# Patient Record
Sex: Female | Born: 1970 | Race: Black or African American | Hispanic: No | Marital: Single | Smoking: Never smoker
Health system: Southern US, Community
[De-identification: ages and names within clinical notes are randomized; demographics above are authoritative.]

## PROBLEM LIST (undated history)

## (undated) DIAGNOSIS — R51 Headache: Secondary | ICD-10-CM

## (undated) DIAGNOSIS — G4700 Insomnia, unspecified: Secondary | ICD-10-CM

## (undated) DIAGNOSIS — Z5189 Encounter for other specified aftercare: Secondary | ICD-10-CM

## (undated) DIAGNOSIS — IMO0001 Reserved for inherently not codable concepts without codable children: Secondary | ICD-10-CM

## (undated) DIAGNOSIS — K219 Gastro-esophageal reflux disease without esophagitis: Secondary | ICD-10-CM

## (undated) DIAGNOSIS — R32 Unspecified urinary incontinence: Secondary | ICD-10-CM

## (undated) DIAGNOSIS — N390 Urinary tract infection, site not specified: Secondary | ICD-10-CM

## (undated) DIAGNOSIS — R030 Elevated blood-pressure reading, without diagnosis of hypertension: Secondary | ICD-10-CM

## (undated) DIAGNOSIS — Z9289 Personal history of other medical treatment: Secondary | ICD-10-CM

## (undated) DIAGNOSIS — K8020 Calculus of gallbladder without cholecystitis without obstruction: Secondary | ICD-10-CM

## (undated) HISTORY — DX: Gastro-esophageal reflux disease without esophagitis: K21.9

## (undated) HISTORY — DX: Urinary tract infection, site not specified: N39.0

## (undated) HISTORY — DX: Headache: R51

## (undated) HISTORY — PX: TUBAL LIGATION: SHX77

## (undated) HISTORY — PX: COLONOSCOPY: SHX174

## (undated) HISTORY — PX: BREAST BIOPSY: SHX20

## (undated) HISTORY — PX: OTHER SURGICAL HISTORY: SHX169

## (undated) HISTORY — DX: Elevated blood-pressure reading, without diagnosis of hypertension: R03.0

## (undated) HISTORY — DX: Unspecified urinary incontinence: R32

---

## 2005-02-18 DIAGNOSIS — Z9289 Personal history of other medical treatment: Secondary | ICD-10-CM

## 2005-02-18 HISTORY — DX: Personal history of other medical treatment: Z92.89

## 2005-02-18 HISTORY — PX: OTHER SURGICAL HISTORY: SHX169

## 2010-09-12 ENCOUNTER — Encounter (INDEPENDENT_AMBULATORY_CARE_PROVIDER_SITE_OTHER): Payer: Self-pay | Admitting: *Deleted

## 2010-09-12 ENCOUNTER — Ambulatory Visit: Payer: Self-pay | Admitting: Family Medicine

## 2010-09-17 NOTE — Letter (Signed)
Summary: [facility] No Show Letter  [facility] [place]  [address] [place], [place] [address]   Phone: [phone]  Fax: [phone]    09/12/2010 MRN: [id number]  Olivia Simon [address] [place], [place]  [address]   Dear Ms. Toney,   Our records indicate that you missed your scheduled appointment with ____Dr.Tabori___________ on ____2/23/12______.  Please contact this office to reschedule your appointment as soon as possible.  It is important that you keep your scheduled appointments with your physician, so we can provide you the best care possible.  Please be advised that there may be a charge for "no show" appointments.    Sincerely,   [facility]

## 2010-10-02 ENCOUNTER — Ambulatory Visit: Payer: Self-pay | Admitting: Family Medicine

## 2010-10-02 DIAGNOSIS — Z0289 Encounter for other administrative examinations: Secondary | ICD-10-CM

## 2011-01-29 ENCOUNTER — Encounter: Payer: Self-pay | Admitting: Family Medicine

## 2011-01-29 ENCOUNTER — Ambulatory Visit (INDEPENDENT_AMBULATORY_CARE_PROVIDER_SITE_OTHER): Payer: 59 | Admitting: Family Medicine

## 2011-01-29 DIAGNOSIS — G4700 Insomnia, unspecified: Secondary | ICD-10-CM

## 2011-01-29 DIAGNOSIS — K649 Unspecified hemorrhoids: Secondary | ICD-10-CM

## 2011-01-29 DIAGNOSIS — K219 Gastro-esophageal reflux disease without esophagitis: Secondary | ICD-10-CM | POA: Insufficient documentation

## 2011-01-29 MED ORDER — ESOMEPRAZOLE MAGNESIUM 40 MG PO CPDR: 40.0000 mg | DELAYED_RELEASE_CAPSULE | Freq: Every day | ORAL | Status: DC

## 2011-01-29 MED ORDER — ZOLPIDEM TARTRATE 5 MG PO TABS: 5.0000 mg | ORAL_TABLET | Freq: Every evening | ORAL | Status: DC | PRN

## 2011-01-29 NOTE — Assessment & Plan Note (Signed)
Pt having difficulty adjusting to her alternating shifts.  Will start low dose Ambien for the first 2-3 days following each shift adjustment.  Pt expressed understanding and is in agreement w/ plan.

## 2011-01-29 NOTE — Patient Instructions (Signed)
Schedule your complete physical at your convenience- do not eat before this appt Start the Nexium daily for your reflux Someone will call you with your surgery appt Take the Ambien for the first 2-3 days after shift change to help w/ sleep Call with any questions or concerns Welcome!  We're glad to have you!

## 2011-01-29 NOTE — Assessment & Plan Note (Addendum)
Restart nexium as pt reports this has worked well in the past.  Reviewed lifestyle modifications.

## 2011-01-29 NOTE — Progress Notes (Signed)
  Subjective:    Patient ID: Olivia Simon, female    DOB: 1970-12-02, 40 y.o.   MRN: [id number]  HPI New to establish care.  [facility] maintenance- UTD on pap, mammo, colonoscopy.  GERD- ongoing problem for pt, worse w/ certain foods like spaghetti, orange juice, citrus fruits, anything acidic.  Was previously on Nexium w/ good results.  Not currently on meds.  Would like to restart.  Insomnia- works as a Biochemist, clinical, has variable work shifts.  Difficulty regulating sleep schedule.  Having difficulty falling asleep and staying asleep.  Particularly hard to fall asleep during the day when she works nights.  Shift will rotate every 2 weeks.  Has tried tylenol PM but has left over 'groggy' feeling in the AM.   Hemorrhoid- first developed during pregnancy 12 yrs ago, 'it will come and go'.  Not currently flared up.  Using Metamucil w/ each meal.  Would like referral to surgeon.   Review of Systems For ROS see HPI     Objective:   Physical Exam  Constitutional: She is oriented to person, place, and time. She appears well-developed and well-nourished. No distress.  HENT:  Head: Normocephalic and atraumatic.  Eyes: Conjunctivae and EOM are normal. Pupils are equal, round, and reactive to light.  Neck: Normal range of motion. Neck supple.  Cardiovascular: Normal rate, regular rhythm, normal heart sounds and intact distal pulses.   No murmur heard. Pulmonary/Chest: Effort normal and breath sounds normal. No respiratory distress. She has no wheezes. She has no rales.  Abdominal: Soft. Bowel sounds are normal. She exhibits no distension. There is no tenderness.  Lymphadenopathy:    She has no cervical adenopathy.  Neurological: She is alert and oriented to person, place, and time. No cranial nerve deficit. Coordination normal.  Skin: Skin is warm and dry.  Psychiatric: She has a normal mood and affect. Her behavior is normal.           Assessment & Plan:

## 2011-01-29 NOTE — Assessment & Plan Note (Signed)
Will refer to general surgery for evaluation and tx

## 2011-02-06 ENCOUNTER — Ambulatory Visit (INDEPENDENT_AMBULATORY_CARE_PROVIDER_SITE_OTHER): Payer: Self-pay | Admitting: General Surgery

## 2011-02-12 ENCOUNTER — Ambulatory Visit (INDEPENDENT_AMBULATORY_CARE_PROVIDER_SITE_OTHER): Payer: 59 | Admitting: Family Medicine

## 2011-02-12 DIAGNOSIS — Z20828 Contact with and (suspected) exposure to other viral communicable diseases: Secondary | ICD-10-CM

## 2011-02-12 DIAGNOSIS — K5900 Constipation, unspecified: Secondary | ICD-10-CM

## 2011-02-12 DIAGNOSIS — J309 Allergic rhinitis, unspecified: Secondary | ICD-10-CM

## 2011-02-12 DIAGNOSIS — G4700 Insomnia, unspecified: Secondary | ICD-10-CM

## 2011-02-12 MED ORDER — POLYETHYLENE GLYCOL 3350 17 GM/SCOOP PO POWD: 17.0000 g | Freq: Every day | ORAL | Status: AC

## 2011-02-12 NOTE — Patient Instructions (Signed)
We'll notify you of your test results Continue the excedrin PM as needed for sleep issues Start the Miralax daily as needed for constipation- goal is 1 bowel movement daily Call with any questions or concerns Hang in there!

## 2011-02-12 NOTE — Progress Notes (Signed)
  Subjective:    Patient ID: Olivia Simon, female    DOB: 21-Apr-1971, 40 y.o.   MRN: [id number]  HPI Insomnia- stopped the Ambien b/c it left her feeling groggy.  Now taking Excedrin PM as needed.  Constipation- reports she suffers from this, 'always have'.  Taking Metamucil TID w/out relief.  Tried colon cleanse w/out relief.  Will have BMs weekly.  STD testing- had oral sex and is now concerned.  Sore throat- having nasal congestion, cough, worse at night, no fevers.  + sick contacts.   Review of Systems For ROS see HPI     Objective:   Physical Exam  Constitutional: She appears well-developed and well-nourished. No distress.  HENT:  Head: Normocephalic and atraumatic.       TMs normal bilaterally Mild nasal congestion Throat w/out erythema, edema, or exudate.  + PND  Eyes: Conjunctivae and EOM are normal. Pupils are equal, round, and reactive to light.  Neck: Normal range of motion. Neck supple.  Cardiovascular: Normal rate, regular rhythm, normal heart sounds and intact distal pulses.   No murmur heard. Pulmonary/Chest: Effort normal and breath sounds normal. No respiratory distress. She has no wheezes.       No cough heard  Abdominal: Soft. Bowel sounds are normal. She exhibits no distension. There is no tenderness. There is no rebound and no guarding.  Lymphadenopathy:    She has no cervical adenopathy.          Assessment & Plan:

## 2011-02-13 LAB — GC/CHLAMYDIA PROBE AMP, URINE: GC Probe Amp, Urine: NEGATIVE

## 2011-02-18 ENCOUNTER — Telehealth: Payer: Self-pay | Admitting: Family Medicine

## 2011-02-18 NOTE — Telephone Encounter (Signed)
Discuss with patient  

## 2011-02-18 NOTE — Telephone Encounter (Signed)
Left message to call office

## 2011-02-18 NOTE — Telephone Encounter (Signed)
Can increase miralax to twice daily and add OTC stool softeners.

## 2011-02-25 DIAGNOSIS — J309 Allergic rhinitis, unspecified: Secondary | ICD-10-CM | POA: Insufficient documentation

## 2011-02-25 NOTE — Assessment & Plan Note (Signed)
No evidence of infxn on PE.  Encouraged OTC antihistamine to control sxs.  Reviewed supportive care and red flags that should prompt return.  Pt expressed understanding and is in agreement w/ plan.

## 2011-02-25 NOTE — Assessment & Plan Note (Signed)
Reviewed that it is possible to get STDs from oral sex.  Unable to openly discuss this as pt has her children w/ her.  Will test pt as requested.

## 2011-02-25 NOTE — Assessment & Plan Note (Signed)
Encouraged regular use of Miralax in addition to the metamucil to facilitate regular BMs. Pt expressed understanding and is in agreement w/ plan.

## 2011-02-25 NOTE — Assessment & Plan Note (Signed)
Pt unable to tolerate ambien due to AM grogginess.  Doing well on excedrin PM.  To continue this prn.

## 2011-03-21 ENCOUNTER — Encounter: Payer: Self-pay | Admitting: Family

## 2011-03-21 ENCOUNTER — Ambulatory Visit (INDEPENDENT_AMBULATORY_CARE_PROVIDER_SITE_OTHER): Payer: 59 | Admitting: Family

## 2011-03-21 VITALS — BP 94/70 | HR 72 | Temp 97.8°F | Resp 16 | Ht 65.0 in | Wt 190.1 lb

## 2011-03-21 DIAGNOSIS — N899 Noninflammatory disorder of vagina, unspecified: Secondary | ICD-10-CM

## 2011-03-21 DIAGNOSIS — N898 Other specified noninflammatory disorders of vagina: Secondary | ICD-10-CM | POA: Insufficient documentation

## 2011-03-21 NOTE — Patient Instructions (Signed)
We will contact you with the results of your testing.  

## 2011-03-21 NOTE — Assessment & Plan Note (Signed)
Wet prep and GC/Chlamydia DNA probe performed today.  Will treat based on test results.

## 2011-03-21 NOTE — Progress Notes (Signed)
  Subjective:    Patient ID: Olivia Simon, female    DOB: Jul 07, 1971, 40 y.o.   MRN: [id number]  HPI  Ms.  Simon is a 40 yr old female who presents today with chief complaint of vaginal irritation.  Denies vaginal pain or discharge. She notes that her symptoms started about 1 week ago.  Reports that she had sex 2 weeks ago for the first time in "years."  Used a condom.  Reports remote history of gonrrhea in high school.    Review of Systems See HPI  Past Medical History  Diagnosis Date  . Headache   . Urinary tract infection   . GERD (gastroesophageal reflux disease)     History   Social History  . Marital Status: Single    Spouse Name: N/A    Number of Children: N/A  . Years of Education: N/A   Occupational History  . Not on file.   Social History Main Topics  . Smoking status: Never Smoker   . Smokeless tobacco: Not on file  . Alcohol Use: Yes     occ.  . Drug Use: No  . Sexually Active:    Other Topics Concern  . Not on file   Social History Narrative  . No narrative on file    Past Surgical History  Procedure Date  . Breast biopsy   . Head surgery     left side  . Thigh surgery     Family History  Problem Relation Age of Onset  . Alcohol abuse Father   . Arthritis    . Breast cancer      grandmother  . Prostate cancer Father   . Stroke Sister   . Hypertension Father   . Diabetes Father     No Known Allergies  Current Outpatient Prescriptions on File Prior to Visit  Medication Sig Dispense Refill  . esomeprazole (NEXIUM) 40 MG capsule Take 1 capsule (40 mg total) by mouth daily.  30 capsule  3  . Multiple Vitamin (DAILY VITAMINS PO) Take by mouth daily.          BP 94/70  Pulse 72  Temp(Src) 97.8 F (36.6 C) (Oral)  Resp 16  Ht 5\' 5"  (1.651 m)  Wt 190 lb 1.3 oz (86.22 kg)  BMI 31.63 kg/m2  LMP 01/24/2011       Objective:   Physical Exam  Constitutional: She appears well-developed and well-nourished.  HENT:  Head:  Normocephalic and atraumatic.  Cardiovascular: Regular rhythm.   Genitourinary:       No vaginal lesions noted.  Creamy white vaginal discharge is noted. Cervix is noted to have nabothian cyst at 1 oclock.  Skin: Skin is warm.  Psychiatric: She has a normal mood and affect. Her behavior is normal. Judgment and thought content normal.          Assessment & Plan:

## 2011-03-21 NOTE — Progress Notes (Signed)
Addended by: Mervin Kung A on: 03/21/2011 04:11 PM   Modules accepted: Orders

## 2011-03-22 LAB — WET PREP BY MOLECULAR PROBE
Candida species: NEGATIVE
Trichomonas vaginosis: NEGATIVE

## 2011-03-24 ENCOUNTER — Telehealth: Payer: Self-pay | Admitting: Family

## 2011-03-24 MED ORDER — METRONIDAZOLE 0.75 % VA GEL: 1.0000 | Freq: Every day | VAGINAL | Status: AC

## 2011-03-24 NOTE — Telephone Encounter (Signed)
Pls call pt on her cell let pt know that her studies are neg for gonorrhea, chlamydia, yeast and trichomonas.  It does show bacterial vaginosis- an overgrowth of vaginal bacteria.  She should use metrogel once daily at bedtime for 5 days.

## 2011-03-25 NOTE — Telephone Encounter (Signed)
Left message on machine to return my call. 

## 2011-03-25 NOTE — Telephone Encounter (Signed)
Patient returned phone call and left voice message to contact her at [phone].   Call returned to patient at the requested phone number, no answer. A detailed voice message was left informing patient per Sandford Craze instructions.

## 2011-04-09 ENCOUNTER — Telehealth: Payer: Self-pay | Admitting: Family Medicine

## 2011-04-09 DIAGNOSIS — Z20828 Contact with and (suspected) exposure to other viral communicable diseases: Secondary | ICD-10-CM

## 2011-04-09 NOTE — Telephone Encounter (Signed)
Patient wants lab for std need order - patient established in July 2012 - are there any other labs she  Needs

## 2011-04-09 NOTE — Telephone Encounter (Signed)
Labs ordered.

## 2011-04-10 NOTE — Telephone Encounter (Signed)
Patient scheduled [id number] cpx - can only come in Friday - her day off

## 2011-04-18 ENCOUNTER — Encounter: Payer: Self-pay | Admitting: Family Medicine

## 2011-04-18 ENCOUNTER — Other Ambulatory Visit (HOSPITAL_COMMUNITY)
Admission: RE | Admit: 2011-04-18 | Discharge: 2011-04-18 | Disposition: A | Discharge status: Home / Self Care | Payer: 59 | Source: Ambulatory Visit | Attending: Family Medicine | Admitting: Family Medicine

## 2011-04-18 ENCOUNTER — Ambulatory Visit (INDEPENDENT_AMBULATORY_CARE_PROVIDER_SITE_OTHER): Payer: 59 | Admitting: Family Medicine

## 2011-04-18 DIAGNOSIS — N760 Acute vaginitis: Secondary | ICD-10-CM | POA: Insufficient documentation

## 2011-04-18 DIAGNOSIS — Z01818 Encounter for other preprocedural examination: Secondary | ICD-10-CM

## 2011-04-18 DIAGNOSIS — R87810 Cervical high risk human papillomavirus (HPV) DNA test positive: Secondary | ICD-10-CM | POA: Insufficient documentation

## 2011-04-18 DIAGNOSIS — Z124 Encounter for screening for malignant neoplasm of cervix: Secondary | ICD-10-CM

## 2011-04-18 DIAGNOSIS — Z01419 Encounter for gynecological examination (general) (routine) without abnormal findings: Secondary | ICD-10-CM | POA: Insufficient documentation

## 2011-04-18 DIAGNOSIS — Z0000 Encounter for general adult medical examination without abnormal findings: Secondary | ICD-10-CM | POA: Insufficient documentation

## 2011-04-18 DIAGNOSIS — Z20828 Contact with and (suspected) exposure to other viral communicable diseases: Secondary | ICD-10-CM

## 2011-04-18 NOTE — Assessment & Plan Note (Signed)
Pt's PE WNL w/ exception of vaginal d/c.  Will hold on tx until lab results available.  Check labs.  UTD on mammo.  Anticipatory guidance provided.

## 2011-04-18 NOTE — Assessment & Plan Note (Signed)
Pt wants to be screened for STDs

## 2011-04-18 NOTE — Assessment & Plan Note (Signed)
Pap collected. 

## 2011-04-18 NOTE — Patient Instructions (Addendum)
We'll notify you of your lab results Someone will call you with your GYN appt Call with any questions or concerns Have a great weekend!

## 2011-04-18 NOTE — Progress Notes (Signed)
  Subjective:    Patient ID: Olivia Simon, female    DOB: 26-Nov-1970, 40 y.o.   MRN: [id number]  HPI CPE- had mammo 6 months ago at [facility]  Due for pap.   Review of Systems Patient reports no vision/ hearing changes, adenopathy,fever, weight change,  persistant/recurrent hoarseness , swallowing issues, chest pain, palpitations, edema, persistant/recurrent cough, hemoptysis, dyspnea (rest/exertional/paroxysmal nocturnal), gastrointestinal bleeding (melena, rectal bleeding), abdominal pain, significant heartburn, bowel changes, GU symptoms (dysuria, hematuria, incontinence), Gyn symptoms (abnormal  bleeding, pain),  syncope, focal weakness, memory loss, numbness & tingling, skin/hair/nail changes, abnormal bruising or bleeding, anxiety, or depression.     Objective:   Physical Exam  General Appearance:    Alert, cooperative, no distress, appears stated age  Head:    Normocephalic, without obvious abnormality, atraumatic  Eyes:    PERRL, conjunctiva/corneas clear, EOM's intact, fundi    benign, both eyes  Ears:    Normal TM's and external ear canals, both ears  Nose:   Nares normal, septum midline, mucosa normal, no drainage    or sinus tenderness  Throat:   Lips, mucosa, and tongue normal; teeth and gums normal  Neck:   Supple, symmetrical, trachea midline, no adenopathy;    Thyroid: no enlargement/tenderness/nodules  Back:     Symmetric, no curvature, ROM normal, no CVA tenderness  Lungs:     Clear to auscultation bilaterally, respirations unlabored  Chest Wall:    No tenderness or deformity   Heart:    Regular rate and rhythm, S1 and S2 normal, no murmur, rub   or gallop  Breast Exam:    No tenderness, masses, or nipple abnormality  Abdomen:     Soft, non-tender, bowel sounds active all four quadrants,    no masses, no organomegaly  Genitalia:    External genitalia normal, cervix normal in appearance, no CMT, uterus in normal size and position, adnexa w/out mass or tenderness,  mucosa pink and moist, no lesions but copious white vaginal DC present  Rectal:    Normal external appearance  Extremities:   Extremities normal, atraumatic, no cyanosis or edema  Pulses:   2+ and symmetric all extremities  Skin:   Skin color, texture, turgor normal, no rashes or lesions  Lymph nodes:   Cervical, supraclavicular, and axillary nodes normal  Neurologic:   CNII-XII intact, normal strength, sensation and reflexes    throughout          Assessment & Plan:

## 2011-04-19 LAB — TSH: TSH: 0.568 u[IU]/mL (ref 0.350–4.500)

## 2011-04-19 LAB — BASIC METABOLIC PANEL
BUN: 12 mg/dL (ref 6–23)
CO2: 13 mEq/L — ABNORMAL LOW (ref 19–32)
Glucose, Bld: 58 mg/dL — ABNORMAL LOW (ref 70–99)
Potassium: 4.9 mEq/L (ref 3.5–5.3)
Sodium: 143 mEq/L (ref 135–145)

## 2011-04-19 LAB — CBC WITH DIFFERENTIAL/PLATELET
Basophils Relative: 0 % (ref 0–1)
HCT: 39.9 % (ref 36.0–46.0)
Hemoglobin: 13 g/dL (ref 12.0–15.0)
Lymphs Abs: 1.7 10*3/uL (ref 0.7–4.0)
MCH: 27.9 pg (ref 26.0–34.0)
MCHC: 32.6 g/dL (ref 30.0–36.0)
Monocytes Absolute: 0.5 10*3/uL (ref 0.1–1.0)
Monocytes Relative: 11 % (ref 3–12)
Neutro Abs: 2.5 10*3/uL (ref 1.7–7.7)
Neutrophils Relative %: 52 % (ref 43–77)
RBC: 4.66 MIL/uL (ref 3.87–5.11)

## 2011-04-19 LAB — HEPATIC FUNCTION PANEL
ALT: 9 U/L (ref 0–35)
AST: 16 U/L (ref 0–37)
Albumin: 3.8 g/dL (ref 3.5–5.2)
Alkaline Phosphatase: 97 U/L (ref 39–117)
Total Bilirubin: 0.4 mg/dL (ref 0.3–1.2)
Total Protein: 7.3 g/dL (ref 6.0–8.3)

## 2011-04-19 LAB — LIPID PANEL
HDL: 38 mg/dL — ABNORMAL LOW (ref >39)
LDL Cholesterol: 76 mg/dL (ref 0–99)

## 2011-04-19 LAB — RPR

## 2011-04-21 ENCOUNTER — Telehealth: Payer: Self-pay

## 2011-04-21 LAB — VITAMIN D 1,25 DIHYDROXY: Vitamin D 1, 25 (OH)2 Total: 62 pg/mL (ref 18–72)

## 2011-04-21 NOTE — Progress Notes (Signed)
Quick Note:  Left a message for pt to return call. ______ 

## 2011-04-21 NOTE — Progress Notes (Signed)
Quick Note:    Labs mailed.  ______

## 2011-04-21 NOTE — Telephone Encounter (Signed)
Pt aware of lab results 

## 2011-04-22 ENCOUNTER — Encounter: Payer: Self-pay | Admitting: Family Medicine

## 2011-04-30 ENCOUNTER — Telehealth: Payer: Self-pay

## 2011-04-30 MED ORDER — METRONIDAZOLE 500 MG PO TABS: 500.0000 mg | ORAL_TABLET | Freq: Two times a day (BID) | ORAL | Status: AC

## 2011-04-30 NOTE — Telephone Encounter (Signed)
Pt aware and results mailed.   Results also faxed to Dr. Lynwood Dawley office

## 2011-04-30 NOTE — Telephone Encounter (Signed)
Message copied by Beverely Low on Wed Apr 30, 2011  4:05 PM ------      Message from: Sheliah Hatch      Created: Wed Apr 30, 2011  3:35 PM       She has BV on her pap- should start Flagyl 500mg  bid x7 days.            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  Has appt upcoming to discuss tubal- this is more pressing

## 2011-05-01 NOTE — Telephone Encounter (Signed)
Called and spoke w/ pt regarding ASCUS pap and presence of high risk HPV.  Explained to her that this does not mean cervical cancer but it is important to f/u w/ GYN to avoid this progressing to eventual cancer.  Discussed that she will likely have a colposcopy and described what this is.  Answered pt's questions.  She feels more comfortable w/ dx and plan.

## 2011-05-16 ENCOUNTER — Other Ambulatory Visit: Payer: 59

## 2011-05-16 ENCOUNTER — Ambulatory Visit (INDEPENDENT_AMBULATORY_CARE_PROVIDER_SITE_OTHER): Payer: 59 | Admitting: Family Medicine

## 2011-05-16 VITALS — BP 100/72 | HR 72 | Temp 98.0°F | Ht 65.5 in | Wt 200.0 lb

## 2011-05-16 DIAGNOSIS — Z20828 Contact with and (suspected) exposure to other viral communicable diseases: Secondary | ICD-10-CM

## 2011-05-16 DIAGNOSIS — N6459 Other signs and symptoms in breast: Secondary | ICD-10-CM

## 2011-05-16 DIAGNOSIS — N644 Mastodynia: Secondary | ICD-10-CM | POA: Insufficient documentation

## 2011-05-16 LAB — HCG, QUANTITATIVE, PREGNANCY: hCG, Beta Chain, Quant, S: 0.7 m[IU]/mL

## 2011-05-16 NOTE — Progress Notes (Signed)
  Subjective:    Patient ID: Olivia Simon, female    DOB: 10-06-1970, 40 y.o.   MRN: [id number]  HPI ? Pregnancy- home test was negative last night.  Has not missed period- due on 11/5.  Pt reports sore nipples which has been a sign of pregnancy before.  Would like blood test done.  Last unprotected sex was 1 week ago.   Review of Systems For ROS see HPI     Objective:   Physical Exam  Vitals reviewed. Constitutional: She appears well-developed and well-nourished. No distress.  Pulmonary/Chest: Right breast exhibits tenderness. Right breast exhibits no mass, no nipple discharge and no skin change. Left breast exhibits tenderness. Left breast exhibits no mass, no nipple discharge and no skin change.  Psychiatric: She has a normal mood and affect. Her behavior is normal. Judgment and thought content normal.          Assessment & Plan:

## 2011-05-16 NOTE — Patient Instructions (Signed)
We'll call you as soon as we have results Call with any questions or concerns Happy Halloween!!!

## 2011-05-16 NOTE — Assessment & Plan Note (Signed)
Pt reports this is similar to how she felt when she was pregnant w/ her 2 sons.  Home upreg negative.  Check blood test.

## 2011-05-17 LAB — RPR

## 2011-05-19 ENCOUNTER — Telehealth: Payer: Self-pay | Admitting: *Deleted

## 2011-05-19 NOTE — Telephone Encounter (Signed)
Pt called this am to find results for tests advised all HIV-STD's advised that the results were negative however the Pregnancy test result was not listed, contacted LAB-call received back that the code is 0.70 noting the result of negative for pregnancy, tried to contact pt via number listed in demographic of [phone], both times calls went to GYN pack-if pt calls in give results and clarify correct number per only one in chart not correct

## 2011-05-22 ENCOUNTER — Encounter (HOSPITAL_COMMUNITY): Payer: Self-pay | Admitting: Pharmacy Technician

## 2011-05-27 NOTE — Patient Instructions (Addendum)
   Your procedure is scheduled on:  Friday, Nov 9th  Enter through the [employer] [facility] at: 10:00am Pick up the phone at the desk and dial [phone] and inform us of your arrival.  Please call this number if you have any problems the morning of surgery: [phone]  Remember: Do not eat food after midnight: Thursday   Do not drink clear liquids after: 7:30 am Friday Take these medicines the morning of surgery with a SIP OF WATER:  nexium  Do not wear jewelry, make-up, or FINGER nail polish Do not wear lotions, powders, or perfumes.  You may not  wear deodorant. Do not shave 48 hours prior to surgery. Do not bring valuables to the hospital.  Patients discharged on the day of surgery will not be allowed to drive home.    Home with Milana Na - friend    Remember to use your hibiclens as instructed.Please shower with 1/2 bottle the evening before your surgery and the other 1/2 bottle the morning of surgery.

## 2011-05-28 ENCOUNTER — Other Ambulatory Visit (HOSPITAL_COMMUNITY): Payer: 59

## 2011-05-28 ENCOUNTER — Encounter (HOSPITAL_COMMUNITY)
Admission: RE | Admit: 2011-05-28 | Discharge: 2011-05-28 | Disposition: A | Discharge status: Home / Self Care | Payer: 59 | Source: Ambulatory Visit | Attending: Obstetrics and Gynecology | Admitting: Obstetrics and Gynecology

## 2011-05-28 ENCOUNTER — Encounter (HOSPITAL_COMMUNITY): Payer: Self-pay

## 2011-05-28 HISTORY — DX: Encounter for other specified aftercare: Z51.89

## 2011-05-28 HISTORY — DX: Reserved for inherently not codable concepts without codable children: IMO0001

## 2011-05-28 LAB — CBC
HCT: 40.8 % (ref 36.0–46.0)
MCV: 86.4 fL (ref 78.0–100.0)
Platelets: 190 10*3/uL (ref 150–400)
RBC: 4.72 MIL/uL (ref 3.87–5.11)
WBC: 4.8 10*3/uL (ref 4.0–10.5)

## 2011-05-28 LAB — SURGICAL PCR SCREEN: Staphylococcus aureus: NEGATIVE

## 2011-05-28 NOTE — Pre-Procedure Instructions (Signed)
OK to see patient DOS 

## 2011-05-30 ENCOUNTER — Other Ambulatory Visit (HOSPITAL_COMMUNITY): Payer: Self-pay | Admitting: Obstetrics and Gynecology

## 2011-05-30 ENCOUNTER — Ambulatory Visit (HOSPITAL_COMMUNITY)
Admission: RE | Admit: 2011-05-30 | Discharge: 2011-05-30 | Disposition: A | Discharge status: Home / Self Care | Payer: 59 | Source: Ambulatory Visit | Attending: Obstetrics and Gynecology | Admitting: Obstetrics and Gynecology

## 2011-05-30 ENCOUNTER — Other Ambulatory Visit: Payer: Self-pay | Admitting: Obstetrics and Gynecology

## 2011-05-30 ENCOUNTER — Encounter (HOSPITAL_COMMUNITY): Payer: Self-pay

## 2011-05-30 ENCOUNTER — Encounter (HOSPITAL_COMMUNITY): Payer: Self-pay | Admitting: Anesthesiology

## 2011-05-30 ENCOUNTER — Encounter (HOSPITAL_COMMUNITY)
Admission: RE | Disposition: A | Discharge status: Home / Self Care | Payer: Self-pay | Source: Ambulatory Visit | Attending: Obstetrics and Gynecology

## 2011-05-30 ENCOUNTER — Ambulatory Visit (HOSPITAL_COMMUNITY): Payer: 59 | Admitting: Anesthesiology

## 2011-05-30 DIAGNOSIS — Z01812 Encounter for preprocedural laboratory examination: Secondary | ICD-10-CM | POA: Insufficient documentation

## 2011-05-30 DIAGNOSIS — Z01818 Encounter for other preprocedural examination: Secondary | ICD-10-CM | POA: Insufficient documentation

## 2011-05-30 DIAGNOSIS — Z302 Encounter for sterilization: Secondary | ICD-10-CM | POA: Insufficient documentation

## 2011-05-30 HISTORY — PX: LAPAROSCOPIC TUBAL LIGATION: SHX1937

## 2011-05-30 LAB — PREGNANCY, URINE: Preg Test, Ur: NEGATIVE

## 2011-05-30 SURGERY — LIGATION, FALLOPIAN TUBE, LAPAROSCOPIC
Anesthesia: General | Site: Abdomen | Laterality: Bilateral | Wound class: Clean Contaminated

## 2011-05-30 MED ORDER — GLYCOPYRROLATE 0.2 MG/ML IJ SOLN
INTRAMUSCULAR | Status: AC
  Filled 2011-05-30: qty 1

## 2011-05-30 MED ORDER — PROPOFOL 10 MG/ML IV EMUL
INTRAVENOUS | Status: AC
  Filled 2011-05-30: qty 20

## 2011-05-30 MED ORDER — KETOROLAC TROMETHAMINE 30 MG/ML IJ SOLN
INTRAMUSCULAR | Status: DC | PRN
  Administered 2011-05-30: 30 mg via INTRAVENOUS

## 2011-05-30 MED ORDER — ROCURONIUM BROMIDE 100 MG/10ML IV SOLN
INTRAVENOUS | Status: DC | PRN
  Administered 2011-05-30: 35 mg via INTRAVENOUS

## 2011-05-30 MED ORDER — FENTANYL CITRATE 0.05 MG/ML IJ SOLN
INTRAMUSCULAR | Status: DC | PRN
  Administered 2011-05-30 (×2): 100 ug via INTRAVENOUS

## 2011-05-30 MED ORDER — FENTANYL CITRATE 0.05 MG/ML IJ SOLN
INTRAMUSCULAR | Status: AC
  Administered 2011-05-30: 50 ug via INTRAVENOUS
  Filled 2011-05-30: qty 2

## 2011-05-30 MED ORDER — FENTANYL CITRATE 0.05 MG/ML IJ SOLN
INTRAMUSCULAR | Status: AC
  Filled 2011-05-30: qty 5

## 2011-05-30 MED ORDER — ONDANSETRON HCL 4 MG/2ML IJ SOLN
INTRAMUSCULAR | Status: DC | PRN
  Administered 2011-05-30: 4 mg via INTRAVENOUS

## 2011-05-30 MED ORDER — OXYCODONE-ACETAMINOPHEN 5-325 MG PO TABS
1.0000 | ORAL_TABLET | ORAL | Status: DC | PRN
  Administered 2011-05-30: 1 via ORAL

## 2011-05-30 MED ORDER — LIDOCAINE HCL (CARDIAC) 20 MG/ML IV SOLN
INTRAVENOUS | Status: AC
  Filled 2011-05-30: qty 5

## 2011-05-30 MED ORDER — DEXAMETHASONE SODIUM PHOSPHATE 10 MG/ML IJ SOLN
INTRAMUSCULAR | Status: AC
  Filled 2011-05-30: qty 1

## 2011-05-30 MED ORDER — LACTATED RINGERS IV SOLN
INTRAVENOUS | Status: DC
  Administered 2011-05-30 (×2): via INTRAVENOUS

## 2011-05-30 MED ORDER — MIDAZOLAM HCL 5 MG/5ML IJ SOLN
INTRAMUSCULAR | Status: DC | PRN
  Administered 2011-05-30: 2 mg via INTRAVENOUS

## 2011-05-30 MED ORDER — NEOSTIGMINE METHYLSULFATE 1 MG/ML IJ SOLN
INTRAMUSCULAR | Status: DC | PRN
  Administered 2011-05-30: 5 mg via INTRAVENOUS

## 2011-05-30 MED ORDER — DEXAMETHASONE SODIUM PHOSPHATE 10 MG/ML IJ SOLN
INTRAMUSCULAR | Status: DC | PRN
  Administered 2011-05-30: 10 mg via INTRAVENOUS

## 2011-05-30 MED ORDER — OXYCODONE-ACETAMINOPHEN 5-325 MG PO TABS
ORAL_TABLET | ORAL | Status: AC
  Administered 2011-05-30: 1 via ORAL
  Filled 2011-05-30: qty 1

## 2011-05-30 MED ORDER — PROPOFOL 10 MG/ML IV EMUL
INTRAVENOUS | Status: DC | PRN
  Administered 2011-05-30: 200 mg via INTRAVENOUS

## 2011-05-30 MED ORDER — KETOROLAC TROMETHAMINE 30 MG/ML IJ SOLN
INTRAMUSCULAR | Status: AC
  Filled 2011-05-30: qty 1

## 2011-05-30 MED ORDER — FENTANYL CITRATE 0.05 MG/ML IJ SOLN
25.0000 ug | INTRAMUSCULAR | Status: DC | PRN
  Administered 2011-05-30 (×2): 50 ug via INTRAVENOUS

## 2011-05-30 MED ORDER — NEOSTIGMINE METHYLSULFATE 1 MG/ML IJ SOLN
INTRAMUSCULAR | Status: AC
  Filled 2011-05-30: qty 10

## 2011-05-30 MED ORDER — ONDANSETRON HCL 4 MG/2ML IJ SOLN
INTRAMUSCULAR | Status: AC
  Filled 2011-05-30: qty 2

## 2011-05-30 MED ORDER — DEXTROSE IN LACTATED RINGERS 5 % IV SOLN: INTRAVENOUS | Status: DC

## 2011-05-30 MED ORDER — DIPHENHYDRAMINE HCL 25 MG PO CAPS
25.0000 mg | ORAL_CAPSULE | Freq: Once | ORAL | Status: DC
  Filled 2011-05-30: qty 1

## 2011-05-30 MED ORDER — MIDAZOLAM HCL 2 MG/2ML IJ SOLN
INTRAMUSCULAR | Status: AC
  Filled 2011-05-30: qty 2

## 2011-05-30 MED ORDER — GLYCOPYRROLATE 0.2 MG/ML IJ SOLN
INTRAMUSCULAR | Status: DC | PRN
  Administered 2011-05-30: .8 mg via INTRAVENOUS

## 2011-05-30 MED ORDER — ROCURONIUM BROMIDE 50 MG/5ML IV SOLN
INTRAVENOUS | Status: AC
  Filled 2011-05-30: qty 1

## 2011-05-30 MED ORDER — LIDOCAINE HCL (CARDIAC) 20 MG/ML IV SOLN
INTRAVENOUS | Status: DC | PRN
  Administered 2011-05-30: 80 mg via INTRAVENOUS

## 2011-05-30 SURGICAL SUPPLY — 19 items
BENZOIN TINCTURE PRP APPL 2/3 (GAUZE/BANDAGES/DRESSINGS) IMPLANT
CATH ROBINSON RED A/P 16FR (CATHETERS) IMPLANT
CATH SILICONE 16FRX5CC (CATHETERS) ×2 IMPLANT
CHLORAPREP W/TINT 26ML (MISCELLANEOUS) ×2 IMPLANT
CLOTH BEACON ORANGE TIMEOUT ST (SAFETY) ×2 IMPLANT
DERMABOND ADVANCED (GAUZE/BANDAGES/DRESSINGS) ×1
DERMABOND ADVANCED .7 DNX12 (GAUZE/BANDAGES/DRESSINGS) ×1 IMPLANT
GLOVE BIO SURGEON STRL SZ 6.5 (GLOVE) ×2 IMPLANT
GLOVE BIOGEL PI IND STRL 7.0 (GLOVE) ×1 IMPLANT
GLOVE BIOGEL PI INDICATOR 7.0 (GLOVE) ×1
GOWN PREVENTION PLUS LG XLONG (DISPOSABLE) ×4 IMPLANT
PACK LAPAROSCOPY BASIN (CUSTOM PROCEDURE TRAY) ×2 IMPLANT
STRIP CLOSURE SKIN 1/4X4 (GAUZE/BANDAGES/DRESSINGS) IMPLANT
SUT VICRYL 0 UR6 27IN ABS (SUTURE) ×2 IMPLANT
SUT VICRYL 4-0 PS2 18IN ABS (SUTURE) ×2 IMPLANT
TOWEL OR 17X24 6PK STRL BLUE (TOWEL DISPOSABLE) ×4 IMPLANT
TROCAR Z-THREAD FIOS 11X100 BL (TROCAR) ×2 IMPLANT
TROCAR Z-THREAD FIOS 5X100MM (TROCAR) ×2 IMPLANT
WATER STERILE IRR 1000ML POUR (IV SOLUTION) ×2 IMPLANT

## 2011-05-30 NOTE — Op Note (Signed)
05/30/2011  10:55 AM  PATIENT:  Olivia Simon  40 y.o. female  PRE-OPERATIVE DIAGNOSIS:  desires sterilization  POST-OPERATIVE DIAGNOSIS:  desires sterilization  PROCEDURE:  Procedure(s): LAPAROSCOPIC TUBAL LIGATION, ASPIRATION OF CYST FLUID  The patient was placed on the table in a supine position. General anesthesia was induced. She was then placed in a dorsal lithotomy position. The abdomen and vagina were sterilely prepped in the usual fashion. The bladder was drained with a Foley catheter.  A single-tooth tenaculum was placed on the cervix.  Then a Hulka tenaculum was placed on the cervix and we went above. An infraumbilical incision was made without difficulty. The trocar was placed without difficulty and the laparoscope was inserted. We confirmed we were in the peritoneal cavity. CO2 was then insufflated into the abdomen. A suprapubic incision was made in the midline and a 5 mm trocar was inserted without difficulty. Upon visualization of the pelvic cavity the uterus and tubes did appear normal.  The right tube was grasped and traced to its fimbriated end. The midportion was cauterized in 3 successive areas and released. The left tube was then identified and traced to its fimbriated end. It was then cauterized in 3 successive areas and released. On the right ovary a cyst was noted.  The ovary of was smooth and the capsule smooth consistent with benign serous cyst.  The cyst aspirator was used to drain the cyst and fluid was removed and sent to cytology. At this point the procedure was terminated and the patient was awakened and taken to her recovery in good condition.    SURGEON:  Surgeon(s): Fortino Sic, MD  PHYSICIAN ASSISTANT: none  ASSISTANTS: NONE   ANESTHESIA:  GENERAL  EBL:  Total I/O In: 1000 [I.V.:1000] Out: 5 [Blood:5]  BLOOD ADMINISTERED:NONE  DRAINS: NONE   LOCAL MEDICATIONS USED:  NONE  SPECIMEN: CYST FLUID  DISPOSITION OF SPECIMEN:   PATHOLOGY  COUNTS:  COTTECT  TOURNIQUET:  * No tourniquets in log *    PLAN OF CARE: Discharge to home after PACU  PATIENT DISPOSITION:  PACU - hemodynamically stable.   Delay start of Pharmacological VTE agent (>24hrs) due to surgical blood loss or risk of bleeding:  {YES/NO/NOT APPLICABLE:20182

## 2011-05-30 NOTE — H&P (Signed)
Olivia Simon is an 40 y.o. female.[id number] who desires elective sterilization. Alternative methods were discussed in detail and the patient requested laparoscopic tubal coagulation. Risks possible complications failure rate and irreversibility of the procedure has been explained and informed consent was given.  Pertinent Gynecological History: Menses:05/22/2011 Contraception: none DES exposure:no Blood transfusions: denies Sexually transmitted diseases denies Previous GYN Procedures:no  Last mammogram:  Date:  2011 Last pap:  Date:03/2011   Menstrual History:  Patient's last menstrual period was 05/23/2011.    Past Medical History  Diagnosis Date  . Headache   . Urinary tract infection   . GERD (gastroesophageal reflux disease)   . Blood transfusion     2006    Past Surgical History  Procedure Date  . Breast biopsy   . Head surgery     left side with metal plate  . Thigh surgery     right  . Svd     x 1  . Cesarean section     x 1    Family History  Problem Relation Age of Onset  . Alcohol abuse Father   . Arthritis    . Breast cancer      grandmother  . Prostate cancer Father   . Stroke Sister   . Hypertension Father   . Diabetes Father     Social History:  reports that she has never smoked. She has never used smokeless tobacco. She reports that she drinks alcohol. She reports that she does not use illicit drugs.  Allergies:  Allergies  Allergen Reactions  . Latex Rash     (Not in a hospital admission)  Review of Systems  Constitutional: Negative.   HENT: Negative.   Eyes: Negative.   Respiratory: Negative.   Cardiovascular: Negative.   Gastrointestinal: Negative.   Genitourinary: Negative.   Musculoskeletal: Negative.   Skin: Negative.   Neurological: Negative.   Endo/Heme/Allergies: Negative.   Psychiatric/Behavioral: Negative.   All other systems reviewed and are negative.    Last menstrual period 05/23/2011. Physical Exam    Constitutional: She is oriented to person, place, and time. She appears well-developed and well-nourished. No distress.  HENT:  Head: Normocephalic.  Mouth/Throat: No oropharyngeal exudate.  Eyes: Conjunctivae are normal. Right eye exhibits no discharge. Left eye exhibits no discharge. No scleral icterus.  Neck: Neck supple. No tracheal deviation present. No thyromegaly present.  Cardiovascular: Normal rate, regular rhythm, normal heart sounds and intact distal pulses.  Exam reveals no gallop and no friction rub.   No murmur heard. Respiratory: She has no wheezes. She has no rales. She exhibits no tenderness.  GI: Bowel sounds are normal. She exhibits no distension and no mass. There is no tenderness. There is no rebound and no guarding.  Genitourinary: Vagina normal and uterus normal. No vaginal discharge found.  Musculoskeletal: Normal range of motion. She exhibits no edema and no tenderness.  Lymphadenopathy:    She has no cervical adenopathy.  Neurological: She is alert and oriented to person, place, and time. She exhibits normal muscle tone. Coordination normal.  Skin: Skin is warm and dry. No rash noted. She is not diaphoretic. No erythema. No pallor.  Psychiatric: She has a normal mood and affect. Her behavior is normal. Judgment and thought content normal.    No results found for this or any previous visit (from the past 24 hour(s)).  No results found.  Assessment/Plan:  40 year old parous female who desires elective sterilization Plan: Laparoscopic tubal coagulation  Wright Gravely E  05/30/2011, 8:21 AM

## 2011-05-30 NOTE — Preoperative (Signed)
Beta Blockers   Reason not to administer Beta Blockers:Not Applicable 

## 2011-05-30 NOTE — Transfer of Care (Signed)
Immediate Anesthesia Transfer of Care Note  Patient: Olivia Simon  Procedure(s) Performed:  LAPAROSCOPIC TUBAL LIGATION  Patient Location: PACU  Anesthesia Type: General  Level of Consciousness: awake, alert , oriented and patient cooperative  Airway & Oxygen Therapy: Patient Spontanous Breathing and Patient connected to nasal cannula oxygen  Post-op Assessment: Report given to PACU RN and Post -op Vital signs reviewed and stable  Post vital signs: Reviewed  Complications: No apparent anesthesia complications

## 2011-05-30 NOTE — Anesthesia Preprocedure Evaluation (Signed)
Anesthesia Evaluation  Patient identified by MRN, date of birth, ID band Patient awake    Reviewed: Allergy & Precautions, H&P , Patient's Chart, lab work & pertinent test results, reviewed documented beta blocker date and time   Airway Mallampati: II TM Distance: >3 FB Neck ROM: full    Dental No notable dental hx.    Pulmonary  clear to auscultation  Pulmonary exam normal       Cardiovascular regular Normal    Neuro/Psych    GI/Hepatic GERD-  Medicated and Controlled,  Endo/Other    Renal/GU      Musculoskeletal   Abdominal   Peds  Hematology   Anesthesia Other Findings   Reproductive/Obstetrics                           Anesthesia Physical Anesthesia Plan  ASA: II  Anesthesia Plan: Epidural   Post-op Pain Management:    Induction: Intravenous  Airway Management Planned: Oral ETT  Additional Equipment:   Intra-op Plan:   Post-operative Plan:   Informed Consent: I have reviewed the patients History and Physical, chart, labs and discussed the procedure including the risks, benefits and alternatives for the proposed anesthesia with the patient or authorized representative who has indicated his/her understanding and acceptance.   Dental Advisory Given  Plan Discussed with: CRNA and Surgeon  Anesthesia Plan Comments: (  Discussed  general anesthesia, including possible nausea, instrumentation of airway, sore throat,pulmonary aspiration, etc. I asked if the were any outstanding questions, or  concerns before we proceeded. )        Anesthesia Quick Evaluation

## 2011-06-02 NOTE — Anesthesia Postprocedure Evaluation (Signed)
  Anesthesia Post-op Note  Patient: Olivia Simon  Procedure(s) Performed:  LAPAROSCOPIC TUBAL LIGATION  Patient is awake and responsive. Pain and nausea are reasonably well controlled. Vital signs are stable and clinically acceptable. Oxygen saturation is clinically acceptable. There are no apparent anesthetic complications at this time. Patient is ready for discharge.

## 2011-06-03 ENCOUNTER — Encounter (HOSPITAL_COMMUNITY): Payer: Self-pay | Admitting: Obstetrics and Gynecology

## 2011-07-07 ENCOUNTER — Encounter: Payer: Self-pay | Admitting: Family Medicine

## 2011-08-29 ENCOUNTER — Telehealth: Payer: Self-pay | Admitting: Family Medicine

## 2011-08-29 NOTE — Telephone Encounter (Signed)
Opened in error. bc °

## 2011-09-01 ENCOUNTER — Ambulatory Visit (INDEPENDENT_AMBULATORY_CARE_PROVIDER_SITE_OTHER): Payer: 59 | Admitting: Family Medicine

## 2011-09-01 ENCOUNTER — Encounter: Payer: Self-pay | Admitting: Family Medicine

## 2011-09-01 VITALS — BP 120/82 | HR 70 | Temp 97.8°F | Ht 65.0 in | Wt 211.2 lb

## 2011-09-01 DIAGNOSIS — R5381 Other malaise: Secondary | ICD-10-CM

## 2011-09-01 DIAGNOSIS — R5383 Other fatigue: Secondary | ICD-10-CM

## 2011-09-01 LAB — CBC WITH DIFFERENTIAL/PLATELET
Basophils Relative: 0.8 % (ref 0.0–3.0)
Eosinophils Relative: 1.3 % (ref 0.0–5.0)
Hemoglobin: 12.9 g/dL (ref 12.0–15.0)
MCV: 87.1 fl (ref 78.0–100.0)
Monocytes Absolute: 0.5 10*3/uL (ref 0.1–1.0)
Neutrophils Relative %: 57.2 % (ref 43.0–77.0)
RBC: 4.47 Mil/uL (ref 3.87–5.11)
WBC: 5 10*3/uL (ref 4.5–10.5)

## 2011-09-01 NOTE — Patient Instructions (Signed)
We'll notify you of your lab results Call with any questions or concerns Hang in there!!!

## 2011-09-01 NOTE — Progress Notes (Signed)
  Subjective:    Patient ID: Olivia Simon, female    DOB: April 03, 1971, 41 y.o.   MRN: [id number]  HPI Fatigue- sxs started over a year ago b/c of her work shift.  Rotating q2 weeks between days and nights.  Is inquiring about the HCG injections- asked GYN who told her no.  Also wants to know if she needs B12 injxns.  Sleeping w/out difficulty if taking Ambien, otherwise has very difficult time sleeping.   Review of Systems For ROS see HPI     Objective:   Physical Exam  Vitals reviewed. Constitutional: She appears well-developed and well-nourished. No distress.  HENT:  Head: Normocephalic and atraumatic.  Eyes: Conjunctivae and EOM are normal. Pupils are equal, round, and reactive to light.  Neck: Normal range of motion. Neck supple. No thyromegaly present.  Cardiovascular: Normal rate, regular rhythm and normal heart sounds.   Pulmonary/Chest: Effort normal and breath sounds normal. No respiratory distress. She has no wheezes. She has no rales.  Lymphadenopathy:    She has no cervical adenopathy.          Assessment & Plan:

## 2011-09-02 NOTE — Assessment & Plan Note (Signed)
Persistent problem for pt.  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  Advised her against BHCG injxns and to stick w/ regular diet and exercise to improve energy.  Will follow.

## 2011-09-03 LAB — VITAMIN D 1,25 DIHYDROXY: Vitamin D3 1, 25 (OH)2: 71 pg/mL

## 2011-09-04 ENCOUNTER — Encounter: Payer: Self-pay | Admitting: *Deleted

## 2011-09-30 ENCOUNTER — Other Ambulatory Visit (HOSPITAL_COMMUNITY)
Admission: RE | Admit: 2011-09-30 | Discharge: 2011-09-30 | Disposition: A | Discharge status: Home / Self Care | Payer: 59 | Source: Ambulatory Visit | Attending: Obstetrics and Gynecology | Admitting: Obstetrics and Gynecology

## 2011-09-30 ENCOUNTER — Other Ambulatory Visit: Payer: Self-pay | Admitting: Obstetrics and Gynecology

## 2011-09-30 DIAGNOSIS — Z124 Encounter for screening for malignant neoplasm of cervix: Secondary | ICD-10-CM | POA: Insufficient documentation

## 2011-10-13 ENCOUNTER — Encounter (HOSPITAL_COMMUNITY): Payer: Self-pay | Admitting: *Deleted

## 2011-10-13 ENCOUNTER — Emergency Department (INDEPENDENT_AMBULATORY_CARE_PROVIDER_SITE_OTHER)
Admission: EM | Admit: 2011-10-13 | Discharge: 2011-10-13 | Disposition: A | Discharge status: Home / Self Care | Payer: 59 | Source: Home / Self Care | Attending: Emergency Medicine | Admitting: Emergency Medicine

## 2011-10-13 DIAGNOSIS — IMO0002 Reserved for concepts with insufficient information to code with codable children: Secondary | ICD-10-CM

## 2011-10-13 DIAGNOSIS — S76119A Strain of unspecified quadriceps muscle, fascia and tendon, initial encounter: Secondary | ICD-10-CM

## 2011-10-13 MED ORDER — HYDROCODONE-ACETAMINOPHEN 5-325 MG PO TABS: ORAL_TABLET | ORAL | Status: AC

## 2011-10-13 NOTE — ED Notes (Signed)
Pt  Reports  Pain l  Upper  Thigh    Pt  States     She  Was  Doing  Exercises     And    Felt a pop   She  Reports  Pain  When  Walking  She  Ambulates  With a  Limp    - she  Is  Awake  As  Well  As  Alert  And  Oriented

## 2011-10-13 NOTE — Discharge Instructions (Signed)
Muscle Strain A muscle strain, or pulled muscle, occurs when a muscle is over-stretched. A small number of muscle fibers may also be torn. This is especially common in athletes. This happens when a sudden violent force placed on a muscle pushes it past its capacity. Usually, recovery from a pulled muscle takes 1 to 2 weeks. But complete healing will take 5 to 6 weeks. There are millions of muscle fibers. Following injury, your body will usually return to normal quickly. HOME CARE INSTRUCTIONS   While awake, apply ice to the sore muscle for 15 to 20 minutes each hour for the first 2 days. Put ice in a plastic bag and place a towel between the bag of ice and your skin.   Do not use the pulled muscle for several days. Do not use the muscle if you have pain.   You may wrap the injured area with an elastic bandage for comfort. Be careful not to bind it too tightly. This may interfere with blood circulation.   Only take over-the-counter or prescription medicines for pain, discomfort, or fever as directed by your caregiver. Do not use aspirin as this will increase bleeding (bruising) at injury site.   Warming up before exercise helps prevent muscle strains.  SEEK MEDICAL CARE IF:  There is increased pain or swelling in the affected area. MAKE SURE YOU:   Understand these instructions.   Will watch your condition.   Will get help right away if you are not doing well or get worse.  Document Released: 07/07/2005 Document Revised: 06/26/2011 Document Reviewed: 02/03/2007 Exit[employer] Patient Information 2012 [employer]. 

## 2011-10-13 NOTE — ED Provider Notes (Signed)
Chief Complaint  Patient presents with  . Leg Pain    History of Present Illness:   Mrs. Criado is a 41 year old female who works at the [employer]. She has been participating in physical training for the past several weeks. Today while doing physical training at work, she twisted her thigh, resulting in pain over the left quadriceps. She thinks this might be little bit swollen. There's been no bruising. It hurts to walk or to lift her leg, the leg feels somewhat tingly and numb, the leg also feels weak, particularly with extension of the thigh.  Review of Systems:  Other than noted above, the patient denies any of the following symptoms: Systemic:  No fevers, chills, sweats, or aches.  No fatigue or tiredness. Musculoskeletal:  No joint pain, arthritis, bursitis, swelling, back pain, or neck pain. Neurological:  No muscular weakness, paresthesias, headache, or trouble with speech or coordination.  No dizziness.   PMFSH:  Past medical history, family history, social history, meds, and allergies were reviewed.  Physical Exam:   Vital signs:  BP 123/79  Pulse 64  Temp(Src) 98.5 F (36.9 C) (Oral)  Resp 18  SpO2 98%  LMP 10/03/2011 Gen:  Alert and oriented times 3.  In no distress. Musculoskeletal: There is no obvious swelling, bruising, or deformity over the quadriceps but there is quite a bit of pain to palpation over the mid to distal quadriceps. There is no palpable tear. She has difficulty in extending at the knee. She has almost her range of motion at the knee. There is no swelling of the knee joint or effusion. Otherwise, all joints had a full a ROM with no swelling, bruising or deformity.  No edema, pulses full. Extremities were warm and pink.  Capillary refill was brisk.  Skin:  Clear, warm and dry.  No rash. Neuro:  Alert and oriented times 3.  Muscle strength was normal.  Sensation was intact to light touch.   Medical Decision Making:  This could be a quadriceps strain,  although I am concerned about the possibility of a ruptured quadriceps tendon.  Course in Urgent Care Center:   She was put and unable to live for and given crutches for ambulation. She was instructed to followup with orthopedist as soon as possible and given a note for no weightbearing on the left leg and no physical training until she was cleared by the orthopedist.  Assessment:   Diagnoses that have been ruled out:  None  Diagnoses that are still under consideration:  None  Final diagnoses:  Quadriceps strain    Plan:   1.  The following meds were prescribed:   New Prescriptions   HYDROCODONE-ACETAMINOPHEN (NORCO) 5-325 MG PER TABLET    1 to 2 tabs every 4 to 6 hours as needed for pain.   2.  The patient was instructed in symptomatic care, including rest and activity, elevation, application of ice and compression.  Appropriate handouts were given. 3.  The patient was told to return if becoming worse in any way, if no better in 3 or 4 days, and given some red flag symptoms that would indicate earlier return.   4.  The patient was told to follow up Dr. Dorene Grebe as soon as possible, in the meantime no weightbearing or physical activity.   Reuben Likes, MD 10/13/11 2122

## 2011-12-01 ENCOUNTER — Telehealth: Payer: Self-pay | Admitting: Family Medicine

## 2011-12-01 NOTE — Telephone Encounter (Signed)
Called pt to advise she will need an apt, however the number listed in the chart is no longer in service, called 2times, called other number listed in demographics [phone], noted this number as temporarily out of service, noted called 2 times also, will try to call again, if pt calls in please ask for updated contact information
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
## 2011-12-02 NOTE — Telephone Encounter (Signed)
Pt called stating she has not received call back from [place]. I advised pt that we did try calling her, but had no working phone number. Phone number has now been updated and patient would like a call back. Ph # [phone]

## 2011-12-02 NOTE — Telephone Encounter (Signed)
Called pt to advise she will need an apt to discuss, pt understood,pt accepted earliest apt available for 12-04-11 at 9:30am, pt accepted

## 2011-12-04 ENCOUNTER — Ambulatory Visit (INDEPENDENT_AMBULATORY_CARE_PROVIDER_SITE_OTHER): Payer: 59 | Admitting: Family Medicine

## 2011-12-04 ENCOUNTER — Encounter: Payer: Self-pay | Admitting: Family Medicine

## 2011-12-04 VITALS — BP 122/80 | HR 72 | Temp 100.1°F | Ht 66.0 in | Wt 210.6 lb

## 2011-12-04 DIAGNOSIS — Z9889 Other specified postprocedural states: Secondary | ICD-10-CM

## 2011-12-04 DIAGNOSIS — Z967 Presence of other bone and tendon implants: Secondary | ICD-10-CM

## 2011-12-04 DIAGNOSIS — G4452 New daily persistent headache (NDPH): Secondary | ICD-10-CM

## 2011-12-04 MED ORDER — IBUPROFEN 800 MG PO TABS: 800.0000 mg | ORAL_TABLET | Freq: Three times a day (TID) | ORAL | Status: DC | PRN

## 2011-12-04 MED ORDER — CYCLOBENZAPRINE HCL 10 MG PO TABS: 10.0000 mg | ORAL_TABLET | Freq: Three times a day (TID) | ORAL | Status: AC | PRN

## 2011-12-04 NOTE — Patient Instructions (Signed)
We'll call you with your neuro appt Start the Flexeril nightly for muscle spasm Continue the ibuprofen (take w/ food) Call with any questions or concerns Hang in there!!!
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
## 2011-12-07 NOTE — Assessment & Plan Note (Signed)
New info for provider.  May be cause of pt's new daily HA.  No current evidence of infxn.  Refer to neuro for complete evaluation.
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
## 2011-12-09 ENCOUNTER — Telehealth: Payer: Self-pay | Admitting: *Deleted

## 2011-12-09 NOTE — Telephone Encounter (Signed)
Spoke to pt to clarify that her headaches are not related to her workman's comp case, pt advised that her headaches are not related to her workmans comp case, advised that this nurse will let our referral coordinator know this is unrelated and that someone will call her asap with her referral apt date/time, pt understood, advised referral coordinator Renee verbally, she advised she will start the process for pt referral and alert pt when apt is available

## 2012-04-02 ENCOUNTER — Other Ambulatory Visit (HOSPITAL_COMMUNITY)
Admission: RE | Admit: 2012-04-02 | Discharge: 2012-04-02 | Disposition: A | Discharge status: Home / Self Care | Payer: 59 | Source: Ambulatory Visit | Attending: Obstetrics and Gynecology | Admitting: Obstetrics and Gynecology

## 2012-04-02 ENCOUNTER — Other Ambulatory Visit: Payer: Self-pay | Admitting: Obstetrics and Gynecology

## 2012-04-02 DIAGNOSIS — Z124 Encounter for screening for malignant neoplasm of cervix: Secondary | ICD-10-CM | POA: Insufficient documentation

## 2012-05-08 ENCOUNTER — Emergency Department (HOSPITAL_COMMUNITY)
Admission: EM | Admit: 2012-05-08 | Discharge: 2012-05-08 | Disposition: A | Discharge status: Home / Self Care | Payer: 59 | Source: Home / Self Care | Attending: Family Medicine | Admitting: Family Medicine

## 2012-05-08 ENCOUNTER — Encounter (HOSPITAL_COMMUNITY): Payer: Self-pay | Admitting: Emergency Medicine

## 2012-05-08 DIAGNOSIS — N760 Acute vaginitis: Secondary | ICD-10-CM

## 2012-05-08 LAB — WET PREP, GENITAL

## 2012-05-08 NOTE — ED Notes (Signed)
Waiting discharge papers 

## 2012-05-08 NOTE — ED Notes (Signed)
Pt states has a hx of recurrent bv and yeast infections  Pt is c/o irritation of vaginal walls and lower back pain.  Pt denies fever or any other symptoms

## 2012-05-08 NOTE — ED Provider Notes (Addendum)
History     CSN: [id number]  Arrival date & time 05/08/12  1721   First MD Initiated Contact with Patient 05/08/12 1727      Chief Complaint  Patient presents with  . Vaginal Itching    (Consider location/radiation/quality/duration/timing/severity/associated sxs/prior treatment) Patient is a 41 y.o. female presenting with vaginal discharge. The history is provided by the patient.  Vaginal Discharge This is a new problem. The current episode started more than 2 days ago (vaginal discomfort -itching , no pain.started menses today.). The problem has not changed since onset.   Past Medical History  Diagnosis Date  . Headache   . Urinary tract infection   . GERD (gastroesophageal reflux disease)   . Blood transfusion     2006    Past Surgical History  Procedure Date  . Breast biopsy   . Head surgery     left side with metal plate  . Thigh surgery     right  . Svd     x 1  . Cesarean section     x 1  . Laparoscopic tubal ligation 05/30/2011    Procedure: LAPAROSCOPIC TUBAL LIGATION;  Surgeon: Fortino Sic, MD;  Location: [facility];  Service: Gynecology;  Laterality: Bilateral;    Family History  Problem Relation Age of Onset  . Alcohol abuse Father   . Arthritis    . Breast cancer      grandmother  . Prostate cancer Father   . Stroke Sister   . Hypertension Father   . Diabetes Father     History  Substance Use Topics  . Smoking status: Never Smoker   . Smokeless tobacco: Never Used  . Alcohol Use: Yes     socially    OB History    Grav Para Term Preterm Abortions TAB SAB Ect Mult Living                  Review of Systems  Constitutional: Negative.   Gastrointestinal: Negative.   Genitourinary: Positive for vaginal discharge. Negative for urgency, frequency, hematuria and vaginal pain.    Allergies  Methadone and Latex  Home Medications   Current Outpatient Rx  Name Route Sig Dispense Refill  . FLUCONAZOLE 150 MG PO TABS      . HYDROXYZINE  HCL 10 MG PO TABS      . IBUPROFEN 800 MG PO TABS Oral Take 1 tablet (800 mg total) by mouth every 8 (eight) hours as needed for pain. 60 tablet 3  . LOVAZA 1 G PO CAPS      . METHOCARBAMOL 750 MG PO TABS      . METRONIDAZOLE 500 MG PO TABS        BP 144/85  Pulse 73  Temp 98.1 F (36.7 C) (Oral)  Resp 18  SpO2 100%  LMP 05/08/2012  Physical Exam  Nursing note and vitals reviewed. Constitutional: She is oriented to person, place, and time. She appears well-developed and well-nourished.  Abdominal: Soft. Bowel sounds are normal. She exhibits no distension and no mass. There is no tenderness. There is no rebound and no guarding.  Genitourinary: Uterus normal. Uterus is not tender. Cervix exhibits discharge. Cervix exhibits no motion tenderness and no friability. Right adnexum displays no tenderness. Left adnexum displays no tenderness. There is erythema and bleeding around the vagina. No tenderness around the vagina. No vaginal discharge found.  Neurological: She is alert and oriented to person, place, and time.  Skin: Skin is warm and  dry.    ED Course  Procedures (including critical care time)   Labs Reviewed  WET PREP, GENITAL  GC/CHLAMYDIA PROBE AMP, GENITAL   No results found.   1. Vaginitis       MDM          Linna Hoff, MD 05/08/12 1610  Linna Hoff, MD 05/08/12 [phone]

## 2012-05-10 LAB — GC/CHLAMYDIA PROBE AMP, GENITAL
Chlamydia, DNA Probe: NEGATIVE
GC Probe Amp, Genital: NEGATIVE

## 2012-05-21 ENCOUNTER — Other Ambulatory Visit: Payer: Self-pay | Admitting: Obstetrics and Gynecology

## 2012-05-21 ENCOUNTER — Other Ambulatory Visit (HOSPITAL_COMMUNITY)
Admission: RE | Admit: 2012-05-21 | Discharge: 2012-05-21 | Disposition: A | Discharge status: Home / Self Care | Payer: 59 | Source: Ambulatory Visit | Attending: Obstetrics and Gynecology | Admitting: Obstetrics and Gynecology

## 2012-05-21 DIAGNOSIS — Z124 Encounter for screening for malignant neoplasm of cervix: Secondary | ICD-10-CM | POA: Insufficient documentation

## 2012-05-21 DIAGNOSIS — Z113 Encounter for screening for infections with a predominantly sexual mode of transmission: Secondary | ICD-10-CM | POA: Insufficient documentation

## 2012-07-06 ENCOUNTER — Ambulatory Visit (INDEPENDENT_AMBULATORY_CARE_PROVIDER_SITE_OTHER): Payer: 59 | Admitting: Family Medicine

## 2012-07-06 ENCOUNTER — Encounter: Payer: Self-pay | Admitting: Family Medicine

## 2012-07-06 ENCOUNTER — Encounter (INDEPENDENT_AMBULATORY_CARE_PROVIDER_SITE_OTHER): Payer: 59

## 2012-07-06 VITALS — BP 106/68 | HR 78 | Temp 98.6°F | Wt 221.0 lb

## 2012-07-06 DIAGNOSIS — M7989 Other specified soft tissue disorders: Secondary | ICD-10-CM

## 2012-07-06 DIAGNOSIS — M79669 Pain in unspecified lower leg: Secondary | ICD-10-CM

## 2012-07-06 DIAGNOSIS — M79609 Pain in unspecified limb: Secondary | ICD-10-CM

## 2012-07-06 NOTE — Patient Instructions (Addendum)
Hematoma  A hematoma is a pocket of blood that collects under the skin, in an organ, in a body space, in a joint space, or in other tissue. The blood can clot to form a lump that you can see and feel. The lump is often firm, sore, and sometimes even painful and tender. Most hematomas get better in a few days to weeks. However, some hematomas may be serious and require medical care.Hematomas can range in size from very small to very large.  CAUSES   A hematoma can be caused by a blunt or penetrating injury. It can also be caused by leakage from a blood vessel under the skin. Spontaneous leakage from a blood vessel is more likely to occur in elderly people, especially those taking blood thinners. Sometimes, a hematoma can develop after certain medical procedures.  SYMPTOMS   Unlike a bruise, a hematoma forms a firm lump that you can feel. This lump is the collection of blood. The collection of blood can also cause your skin to turn a blue to dark blue color. If the hematoma is close to the surface of the skin, it often produces a yellowish color in the skin.  DIAGNOSIS   Your caregiver can determine whether you have a hematoma based on your history and a physical exam.  TREATMENT   Hematomas usually go away on their own over time. Rarely does the blood need to be drained out of the body.  HOME CARE INSTRUCTIONS    Put ice on the injured area.   Put ice in a plastic bag.   Place a towel between your skin and the bag.   Leave the ice on for 15 to 20 minutes, 3 to 4 times a day for the first 1 to 2 days.   After the first 2 days, switch to using warm compresses on the hematoma.   Elevate the injured area to help decrease pain and swelling. Wrapping the area with an elastic bandage may also be helpful. Compression helps to reduce swelling and promotes shrinking of the hematoma. Make sure the bandage is not wrapped too tight.   If your hematoma is on a lower extremity and is painful, crutches may be helpful for a  couple days.   Only take over-the-counter or prescription medicines for pain, discomfort, or fever as directed by your caregiver. Most patients can take acetaminophen or ibuprofen for the pain.  SEEK IMMEDIATE MEDICAL CARE IF:    You have increasing pain, or your pain is not controlled with medicine.   You have a fever.   You have worsening swelling or discoloration.   Your skin over the hematoma breaks or starts bleeding.  MAKE SURE YOU:    Understand these instructions.   Will watch your condition.   Will get help right away if you are not doing well or get worse.  Document Released: 02/19/2004 Document Revised: 09/29/2011 Document Reviewed: 03/10/2011  [employer] Patient Information 2013 [employer].

## 2012-07-06 NOTE — Progress Notes (Signed)
  Subjective:    Patient ID: Olivia Simon, female    DOB: [birth year], 41 y.o.   MRN: [id number]  HPI Pt here c/o bruise and pain R leg.  Pt fell through attic floor at her mothers in [place], [place] 1 week ago. Pt c/o calf pain and bruising.  No cp, sob, no palpitations.   Review of Systems As above    Objective:   Physical Exam  BP 106/68  Pulse 78  Temp 98.6 F (37 C) (Oral)  Wt 221 lb (100.245 kg)  SpO2 99% General appearance: alert, cooperative, appears stated age and no distress Extremities: + ecchymosis R thigh and low leg,  + R calf pain,  no swelling      Assessment & Plan:  Contusion R leg-- with calf pain                Check doppler venous                  Warm compresses                 Elevated ext                 rto prn

## 2012-07-07 ENCOUNTER — Telehealth: Payer: Self-pay

## 2012-07-07 NOTE — Telephone Encounter (Signed)
Detailed message left on VM making patient aware of results and to call with questions.     KP

## 2012-07-07 NOTE — Telephone Encounter (Signed)
Message copied by Arnette Norris on Wed Jul 07, 2012 10:25 AM ------      Message from: Lelon Perla      Created: Tue Jul 06, 2012  3:15 PM      Regarding: RE: prelim       Neg for DVT      ----- Message -----         From: Richardean Canal         Sent: 07/06/2012   1:59 PM           To: Lelon Perla, DO      Subject: prelim                                                   Duplex imaging of the right lower extremity appears negative for acute DVT.                  Thank you

## 2012-10-06 ENCOUNTER — Ambulatory Visit (INDEPENDENT_AMBULATORY_CARE_PROVIDER_SITE_OTHER): Payer: 59 | Admitting: Family Medicine

## 2012-10-06 ENCOUNTER — Encounter: Payer: Self-pay | Admitting: Family Medicine

## 2012-10-06 VITALS — BP 108/90 | HR 80 | Temp 97.7°F | Ht 65.25 in | Wt 227.2 lb

## 2012-10-06 DIAGNOSIS — N3941 Urge incontinence: Secondary | ICD-10-CM | POA: Insufficient documentation

## 2012-10-06 DIAGNOSIS — M545 Low back pain, unspecified: Secondary | ICD-10-CM

## 2012-10-06 DIAGNOSIS — M533 Sacrococcygeal disorders, not elsewhere classified: Secondary | ICD-10-CM | POA: Insufficient documentation

## 2012-10-06 MED ORDER — IBUPROFEN 800 MG PO TABS: 800.0000 mg | ORAL_TABLET | Freq: Three times a day (TID) | ORAL | Status: DC | PRN

## 2012-10-06 MED ORDER — CYCLOBENZAPRINE HCL 5 MG PO TABS: 5.0000 mg | ORAL_TABLET | Freq: Three times a day (TID) | ORAL | Status: DC | PRN

## 2012-10-06 NOTE — Patient Instructions (Addendum)
Call Dr Neva Seat and discuss your fibroid possibly causing your urinary issues If she doesn't feel it is related to the fibroid- call me and we'll get you started on a medicine to stop the bladder spasm Continue the ThermaCare patches as needed Use the Ibuprofen for pain/inflammation (take w/ food) Use the flexeril as needed (muscle relaxer)- it will make you sleepy Call with any questions or concerns Hang in there!!!

## 2012-10-06 NOTE — Progress Notes (Signed)
  Subjective:    Patient ID: Olivia Simon, female    DOB: 1970-09-10, 42 y.o.   MRN: [id number]  HPI Urinary urgency- pt having urge incontinence.  Does ok as long as she's aware of restroom locations and can get there.  'there's no holding it'.  Will at times dribble prior to making it to restroom.  Mom had similar problems due to fibroids.  Pt w/ known fibroid.  No leakage w/ cough, sneeze, or when unaware of needing to urinate.  sxs 1st became problematic ~1 month ago but worsening.  Has not had fibroid assessed since birth of child 5 yrs ago.  Back pain- 'excrutiating'  Wearing thermacare patches w/ some relief.  Pain will come and go but is a frequent problem.  sxs started 'a couple of weeks ago'.  Pain is band like across lumbar spine.  Pain w/ bending forward, no pain w/ arching back.  Some radiation of pain into buttocks.  No nausea or vomiting.  No recent change in activity or known injury.   Review of Systems For ROS see HPI     Objective:   Physical Exam  Vitals reviewed. Constitutional: She is oriented to person, place, and time. She appears well-developed and well-nourished. No distress.  Abdominal: Soft. Bowel sounds are normal. She exhibits no distension and no mass. There is no tenderness. There is no rebound and no guarding.  Musculoskeletal:  + TTP over lumbar paraspinals bilaterally (-) SLR bilaterally Good flexion/extension  Neurological: She is alert and oriented to person, place, and time. She has normal reflexes. No cranial nerve deficit. Coordination normal.          Assessment & Plan:

## 2012-10-09 NOTE — Assessment & Plan Note (Signed)
New.  No red flags on hx or PE.  Start scheduled NSAIDs.  Muscle relaxers prn.  Heat.  Reviewed supportive care and red flags that should prompt return.  Pt expressed understanding and is in agreement w/ plan.

## 2012-10-09 NOTE — Assessment & Plan Note (Signed)
New.  Offered meds for OAB or urology referral but pt declines at this time.  After discussion about fibroids, pt prefers to discuss this w/ GYN first.  Will follow closely.

## 2013-02-11 ENCOUNTER — Encounter: Payer: Self-pay | Admitting: Family Medicine

## 2013-05-24 ENCOUNTER — Ambulatory Visit: Payer: 59 | Admitting: Family Medicine

## 2013-05-25 ENCOUNTER — Ambulatory Visit: Payer: 59 | Admitting: Family Medicine

## 2013-05-27 ENCOUNTER — Encounter: Payer: Self-pay | Admitting: Family Medicine

## 2013-05-27 ENCOUNTER — Ambulatory Visit (INDEPENDENT_AMBULATORY_CARE_PROVIDER_SITE_OTHER): Payer: 59 | Admitting: Family Medicine

## 2013-05-27 VITALS — BP 126/83 | Ht 65.0 in | Wt 221.0 lb

## 2013-05-27 DIAGNOSIS — T7491XA Unspecified adult maltreatment, confirmed, initial encounter: Secondary | ICD-10-CM

## 2013-05-27 DIAGNOSIS — IMO0002 Reserved for concepts with insufficient information to code with codable children: Secondary | ICD-10-CM | POA: Insufficient documentation

## 2013-05-27 DIAGNOSIS — M545 Low back pain, unspecified: Secondary | ICD-10-CM | POA: Insufficient documentation

## 2013-05-27 DIAGNOSIS — E669 Obesity, unspecified: Secondary | ICD-10-CM | POA: Insufficient documentation

## 2013-05-27 MED ORDER — CYCLOBENZAPRINE HCL 5 MG PO TABS: 5.0000 mg | ORAL_TABLET | Freq: Three times a day (TID) | ORAL | Status: DC | PRN

## 2013-05-27 MED ORDER — TOPIRAMATE 25 MG PO TABS: 25.0000 mg | ORAL_TABLET | Freq: Two times a day (BID) | ORAL | Status: DC
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
## 2013-09-30 ENCOUNTER — Encounter: Payer: Self-pay | Admitting: Podiatry

## 2013-09-30 ENCOUNTER — Ambulatory Visit (INDEPENDENT_AMBULATORY_CARE_PROVIDER_SITE_OTHER): Payer: 59 | Admitting: Podiatry

## 2013-09-30 VITALS — BP 121/73 | HR 76 | Resp 16 | Ht 65.0 in | Wt 209.0 lb

## 2013-09-30 DIAGNOSIS — L84 Corns and callosities: Secondary | ICD-10-CM

## 2013-09-30 DIAGNOSIS — M2040 Other hammer toe(s) (acquired), unspecified foot: Secondary | ICD-10-CM

## 2013-09-30 DIAGNOSIS — L600 Ingrowing nail: Secondary | ICD-10-CM

## 2013-09-30 NOTE — Progress Notes (Signed)
   Subjective:    Patient ID: Olivia Simon, female    DOB: 12/05/1970, 43 y.o.   MRN: [id number]  HPI    Review of Systems  Constitutional: Negative.   HENT: Negative.   Eyes: Negative.   Respiratory: Negative.   Cardiovascular: Negative.   Gastrointestinal: Positive for constipation.  Endocrine: Negative.   Genitourinary: Positive for frequency.  Musculoskeletal: Negative.   Skin: Negative.   Allergic/Immunologic: Negative.   Neurological: Negative.   Hematological: Negative.   Psychiatric/Behavioral: Negative.        Objective:   Physical Exam        Assessment & Plan:

## 2013-09-30 NOTE — Patient Instructions (Signed)

## 2013-09-30 NOTE — Progress Notes (Signed)
Subjective:     Patient ID: Olivia Simon, female   DOB: 09/27/1970, 43 y.o.   MRN: [id number]  HPI patient presents stating I have a painful ingrown toenail on my right big toe and I have these calluses on both feet do become irritated and my right fifth toe continues to give me trouble and I shouldn't fixed it last year like he wanted   Review of Systems     Objective:   Physical Exam Neurovascular status intact with patient well oriented x3 and no health history changes noted. Incurvated lateral border right hallux with damage to the entire nail plate and keratotic lesion fifth right and underneath the first metatarsal of both feet was noted    Assessment:     Chronic ingrown toenail deformity right hallux and keratotic tissue formation we are reactive forces occur    Plan:     H&P and condition reviewed. We're going to focus on the nail and I did explain ultimately she may lose the entire nail to wear them to try to work on the corner. She understands risk and wants procedure and today I infiltrated the right hallux 60 mg Xylocaine Marcaine mixture remove the lateral corner exposed matrix and applied chemical phenol 3 application x30 seconds and then alcohol lavaged and sterile dressing. Gave instructions on soaks and debrided all tissue on both feet

## 2013-10-07 ENCOUNTER — Ambulatory Visit (INDEPENDENT_AMBULATORY_CARE_PROVIDER_SITE_OTHER): Payer: 59 | Admitting: Podiatry

## 2013-10-07 ENCOUNTER — Encounter: Payer: Self-pay | Admitting: Podiatry

## 2013-10-07 VITALS — BP 120/73 | HR 76 | Resp 16

## 2013-10-07 DIAGNOSIS — L600 Ingrowing nail: Secondary | ICD-10-CM

## 2013-10-07 NOTE — Progress Notes (Signed)
Subjective:     Patient ID: Olivia Simon, female   DOB: 07/16/1971, 43 y.o.   MRN: [id number]  HPI patient states I was concerned about my left big toenail not healing properly after procedure of several months ago for ingrown toenail   Review of Systems     Objective:   Physical Exam Neurovascular status intact with healing left and a small amount of crusted tissue still in the proximal portion localized with no drainage edema erythema associated with it    Assessment:     Slow healing left hallux which should ultimately heal without issue    Plan:     Continue soaks and dry bandage usage any change should occur she is to let us know

## 2013-10-07 NOTE — Progress Notes (Signed)
Nail check right great toenail

## 2014-02-09 ENCOUNTER — Telehealth: Payer: Self-pay | Admitting: Family Medicine

## 2014-02-09 NOTE — Telephone Encounter (Signed)
A user error has taken place.

## 2014-02-13 ENCOUNTER — Telehealth: Payer: Self-pay | Admitting: Family Medicine

## 2014-02-13 NOTE — Telephone Encounter (Signed)
Can you please triage pt and find out what is going on with the pelvis enlagement.

## 2014-02-13 NOTE — Telephone Encounter (Signed)
Left a message for call back.  

## 2014-02-13 NOTE — Telephone Encounter (Signed)
Pt states her pelvis is enlarged and needs to be seen.  She needs an appointment between 02-929 due to her work schedule.  Is there any place I can work her in??

## 2014-02-13 NOTE — Telephone Encounter (Signed)
Pt states she was told she needed to get approval to re-establish with Dr. Beverely Lowabori. Please advise.

## 2014-02-13 NOTE — Telephone Encounter (Signed)
No need to re-establish pt was seen last year. Pt just needs to be aware that due to our heavy pt volume she may not always be able to see tabori but there will always be a provider for her to see.

## 2014-02-14 NOTE — Telephone Encounter (Addendum)
Spoke with patient who reported "pelvic enlargement" x 2 months with urinary incontinence and decrease sexual satisfaction.  Was seeing Dr. Chilton SiGreen, OB-GYN at [facility], but has been unable to contact her recently.  She was referred to [facility] by Dr. Chilton SiGreen.  Diagnostic test were performed and they agreed that her "pelvis" was enlarged.  They suggested kegel exercises, but patient has seen no change in her condition and wants to be seen by a provider.  She denies pain, abnormal bleeding, or abdominal swelling.  Menstrual cycle regular.    Advice:  Appointment schedule with Dr. Beverely Lowabori tomorrow (02/15/14) at 0800.

## 2014-02-15 ENCOUNTER — Encounter: Payer: Self-pay | Admitting: Family Medicine

## 2014-02-15 ENCOUNTER — Ambulatory Visit (INDEPENDENT_AMBULATORY_CARE_PROVIDER_SITE_OTHER): Payer: 59 | Admitting: Family Medicine

## 2014-02-15 VITALS — BP 110/68 | HR 90 | Temp 97.8°F | Resp 16 | Wt 209.5 lb

## 2014-02-15 DIAGNOSIS — R9389 Abnormal findings on diagnostic imaging of other specified body structures: Secondary | ICD-10-CM

## 2014-02-15 DIAGNOSIS — Z01411 Encounter for gynecological examination (general) (routine) with abnormal findings: Secondary | ICD-10-CM | POA: Insufficient documentation

## 2014-02-15 NOTE — Progress Notes (Signed)
Pre visit review using our clinic review tool, if applicable. No additional management support is needed unless otherwise documented below in the visit note. 

## 2014-02-15 NOTE — Patient Instructions (Signed)
Schedule your complete physical at your convenience We'll call you with your appt w/ Dr Neva SeatGreene Continue the Kegel exercises multiple times/day Call with any questions or concerns Hang in there!!

## 2014-02-15 NOTE — Progress Notes (Signed)
   Subjective:    Patient ID: Olivia HaroldPatrice L Gardin, female    DOB: 05/22/1971, 43 y.o.   MRN: [id number]  HPI GYN- Neva SeatGreene, 'i can't find her'.  Was referred to [facility] due to sensation of vaginal looseness.  Was told that her vaginal vault was 'large'.  Was told to work on kegel exercises but this is not helping.  No reports of vaginal or bladder prolapse.  Pt denies fibroids or uterine enlargement.  No abnormal bleeding.   Review of Systems For ROS see HPI     Objective:   Physical Exam  Vitals reviewed. Constitutional: She is oriented to person, place, and time. She appears well-developed and well-nourished. No distress.  HENT:  Head: Normocephalic and atraumatic.  Cardiovascular: Normal rate, regular rhythm and normal heart sounds.   Pulmonary/Chest: Effort normal and breath sounds normal. No respiratory distress. She has no wheezes. She has no rales.  Abdominal: Soft. Bowel sounds are normal. She exhibits no distension. There is no tenderness. There is no rebound.  Genitourinary:  Deferred at pt's request  Musculoskeletal: She exhibits no edema.  Neurological: She is alert and oriented to person, place, and time.  Skin: Skin is warm and dry.  Psychiatric: She has a normal mood and affect. Her behavior is normal. Thought content normal.          Assessment & Plan:

## 2014-02-15 NOTE — Assessment & Plan Note (Signed)
Pt's concern today is vaginal enlargement 'floppiness'.  Has seen GYN previously but reports she cannot find previous provider.  Doing Kegel's w/o improvement.  Will fax today's note to GYN and refer for ongoing tx.  Pt expressed understanding and is in agreement w/ plan.

## 2014-02-28 ENCOUNTER — Telehealth: Payer: Self-pay | Admitting: Family Medicine

## 2014-02-28 DIAGNOSIS — R350 Frequency of micturition: Secondary | ICD-10-CM

## 2014-02-28 NOTE — Telephone Encounter (Signed)
Pt can have Urology referral for urinary frequency at her request

## 2014-02-28 NOTE — Telephone Encounter (Signed)
Caller name: Rosaria Ferriesatrice Micke  Relation to pt: Self Call back number: [phone]   Reason for call: pt is exp. Frequent urination and would like you to refer her to specialist. Please Francee Piccoloadivse

## 2014-02-28 NOTE — Telephone Encounter (Signed)
Referral placed.

## 2014-03-02 ENCOUNTER — Telehealth: Payer: Self-pay

## 2014-03-02 NOTE — Telephone Encounter (Signed)
Caller name:Anaiah Relation to pt: Call back number:[phone] Pharmacy:  Reason for call: Olivia Simon called back, because she had not heard from anyone about if she should come in and see Dr Beverely Lowabori or be referred to a Urologist.

## 2014-03-02 NOTE — Telephone Encounter (Signed)
Called pt and lmovm informing of appt, date, time, provider, location and phone number.

## 2014-03-02 NOTE — Telephone Encounter (Signed)
Caller name:Akeiba  Relation to pt: Call back number: Pharmacy:  Reason for call:

## 2014-03-02 NOTE — Telephone Encounter (Signed)
Error

## 2014-03-13 ENCOUNTER — Telehealth: Payer: Self-pay | Admitting: Family Medicine

## 2014-03-13 DIAGNOSIS — Z01411 Encounter for gynecological examination (general) (routine) with abnormal findings: Secondary | ICD-10-CM

## 2014-03-13 DIAGNOSIS — R350 Frequency of micturition: Secondary | ICD-10-CM

## 2014-03-13 NOTE — Telephone Encounter (Signed)
Noted  

## 2014-03-13 NOTE — Telephone Encounter (Signed)
Pt was seeking information regarding Dr. Rudean Haskell OB GYN and wanted to make you aware Dr. Chilton Si is practicing again and pt will be making an appointment.   Dr. Rudean Haskell  Address: [address], [place], [place] [address]  [phone]

## 2014-03-15 NOTE — Telephone Encounter (Signed)
Called pt and LMOVM to call back and inform if Dr Chilton Si is placing this referral or are we? Also I need to know why the referral is needed.

## 2014-03-15 NOTE — Telephone Encounter (Signed)
Is Dr Chilton Si referring or do we need to do this?

## 2014-03-15 NOTE — Telephone Encounter (Signed)
Referral placed.

## 2014-03-15 NOTE — Telephone Encounter (Signed)
Caller name: Shruthi  Call back number:[phone]    Reason for call:  Pt called back and stated that Dr. Chilton Si wants her to see a gyn urologist.  Pt is needing a referral placed.  Prefers [facility].

## 2014-06-02 ENCOUNTER — Telehealth: Payer: Self-pay | Admitting: General Practice

## 2014-06-02 ENCOUNTER — Other Ambulatory Visit (INDEPENDENT_AMBULATORY_CARE_PROVIDER_SITE_OTHER): Payer: 59

## 2014-06-02 DIAGNOSIS — R312 Other microscopic hematuria: Secondary | ICD-10-CM

## 2014-06-02 DIAGNOSIS — R3129 Other microscopic hematuria: Secondary | ICD-10-CM

## 2014-06-02 NOTE — Telephone Encounter (Signed)
Ok for UA but pt needs to schedule appt

## 2014-06-02 NOTE — Telephone Encounter (Signed)
Noted. Pt already on schedule.

## 2014-06-02 NOTE — Telephone Encounter (Signed)
Received a fax from Dr. Varney BaasKawasaki from [facility]. They would like pt to come to the office for a Complete UA Dx. R31.2. Pt was scheduled for 3:15pm today. Please advise if ok. Pt has not had a CPE with our office since 2012. Please advise.

## 2014-06-03 LAB — URINALYSIS, ROUTINE W REFLEX MICROSCOPIC
BILIRUBIN URINE: NEGATIVE
GLUCOSE, UA: NEGATIVE mg/dL
KETONES UR: NEGATIVE mg/dL
Leukocytes, UA: NEGATIVE
Nitrite: NEGATIVE
PROTEIN: NEGATIVE mg/dL
Specific Gravity, Urine: 1.024 (ref 1.005–1.030)
Urobilinogen, UA: 1 mg/dL (ref 0.0–1.0)
pH: 6 (ref 5.0–8.0)

## 2014-06-03 LAB — URINALYSIS, MICROSCOPIC ONLY
Bacteria, UA: NONE SEEN
CRYSTALS: NONE SEEN
Casts: NONE SEEN
SQUAMOUS EPITHELIAL / LPF: NONE SEEN

## 2014-06-06 ENCOUNTER — Encounter: Payer: Self-pay | Admitting: *Deleted

## 2014-06-06 ENCOUNTER — Emergency Department
Admission: EM | Admit: 2014-06-06 | Discharge: 2014-06-06 | Disposition: A | Discharge status: Home / Self Care | Payer: 59 | Source: Home / Self Care | Attending: Physician Assistant | Admitting: Physician Assistant

## 2014-06-06 ENCOUNTER — Other Ambulatory Visit: Payer: Self-pay | Admitting: Physician Assistant

## 2014-06-06 ENCOUNTER — Telehealth: Payer: Self-pay | Admitting: *Deleted

## 2014-06-06 ENCOUNTER — Encounter (HOSPITAL_BASED_OUTPATIENT_CLINIC_OR_DEPARTMENT_OTHER): Payer: Self-pay

## 2014-06-06 ENCOUNTER — Ambulatory Visit (HOSPITAL_BASED_OUTPATIENT_CLINIC_OR_DEPARTMENT_OTHER)
Admission: RE | Admit: 2014-06-06 | Discharge: 2014-06-06 | Disposition: A | Discharge status: Home / Self Care | Payer: 59 | Source: Ambulatory Visit | Attending: Physician Assistant | Admitting: Physician Assistant

## 2014-06-06 DIAGNOSIS — R1110 Vomiting, unspecified: Secondary | ICD-10-CM | POA: Diagnosis not present

## 2014-06-06 DIAGNOSIS — R10819 Abdominal tenderness, unspecified site: Secondary | ICD-10-CM

## 2014-06-06 DIAGNOSIS — K8020 Calculus of gallbladder without cholecystitis without obstruction: Secondary | ICD-10-CM

## 2014-06-06 DIAGNOSIS — R109 Unspecified abdominal pain: Secondary | ICD-10-CM | POA: Diagnosis present

## 2014-06-06 DIAGNOSIS — R197 Diarrhea, unspecified: Secondary | ICD-10-CM | POA: Diagnosis not present

## 2014-06-06 DIAGNOSIS — R1013 Epigastric pain: Secondary | ICD-10-CM

## 2014-06-06 DIAGNOSIS — R112 Nausea with vomiting, unspecified: Secondary | ICD-10-CM

## 2014-06-06 LAB — POCT URINALYSIS DIP (MANUAL ENTRY)
BILIRUBIN UA: NEGATIVE
GLUCOSE UA: NEGATIVE
Ketones, POC UA: NEGATIVE
LEUKOCYTES UA: NEGATIVE
NITRITE UA: NEGATIVE
Protein Ur, POC: NEGATIVE
Spec Grav, UA: 1.02 (ref 1.005–1.03)
Urobilinogen, UA: >=8 (ref 0–1)
pH, UA: 6 (ref 5–8)

## 2014-06-06 MED ORDER — ONDANSETRON HCL 4 MG PO TABS: 4.0000 mg | ORAL_TABLET | Freq: Three times a day (TID) | ORAL | Status: DC | PRN

## 2014-06-06 MED ORDER — IOHEXOL 300 MG/ML  SOLN: 100.0000 mL | Freq: Once | INTRAMUSCULAR | Status: AC | PRN

## 2014-06-06 MED ORDER — GI COCKTAIL ~~LOC~~
30.0000 mL | Freq: Once | ORAL | Status: AC
Start: 2014-06-06 — End: 2014-06-06
  Administered 2014-06-06: 30 mL via ORAL

## 2014-06-06 MED ORDER — ONDANSETRON 8 MG PO TBDP
8.0000 mg | ORAL_TABLET | Freq: Once | ORAL | Status: AC
  Administered 2014-06-06: 8 mg via ORAL

## 2014-06-06 MED ORDER — PANTOPRAZOLE SODIUM 40 MG PO TBEC: 40.0000 mg | DELAYED_RELEASE_TABLET | Freq: Every day | ORAL | Status: DC

## 2014-06-06 NOTE — ED Notes (Signed)
Olivia Simon c/o vomiting and diarrhea x 2 days, none yesterday. Still c/o abdominal cramping and fatigue.

## 2014-06-06 NOTE — Discharge Instructions (Signed)

## 2014-06-06 NOTE — ED Provider Notes (Addendum)
CSN: [id number]     Arrival date & time 06/06/14  [id number] History   First MD Initiated Contact with Patient 06/06/14 [phone]     Chief Complaint  Patient presents with  . Abdominal Pain  . Diarrhea  . Fatigue   (Consider location/radiation/quality/duration/timing/severity/associated sxs/prior Treatment) HPI  Pt presents to the clinic with 3 days of intermittent, dull, epigastric pain. Started on Saturday after drinking 2-3 cocktails without eating. She woke up on Sunday morning with more pain. She vomitted 3 times and had 3 loose stools. No blood in stools. She ate 1/2 cup of chicken broth all day Sunday. Monday she did feel better. Nausea but not vomiting. BM have normalized. Monday she ate a whole cup of soup. She has continued to drink water and ginger ale. She does have hx of acid reflux but does not take anything regularly. Denies any urinary symptoms or back pain. No fever.   Past Medical History  Diagnosis Date  . Headache(784.0)   . Urinary tract infection   . GERD (gastroesophageal reflux disease)   . Blood transfusion     20 06   Past Surgical History  Procedure Laterality Date  . Breast biopsy    . Head surgery      left side with metal plate  . Thigh surgery      right  . Svd      x 1  . Cesarean section      x 1  . Laparoscopic tubal ligation  05/30/2011    Procedure: LAPAROSCOPIC TUBAL LIGATION;  Surgeon: Fortino SicEleanor E Greene, MD;  Location: [facility];  Service: Gynecology;  Laterality: Bilateral;   Family History  Problem Relation Age of Onset  . Alcohol abuse Father   . Arthritis    . Breast cancer      grandmother  . Prostate cancer Father   . Stroke Sister   . Hypertension Father   . Diabetes Father    History  Substance Use Topics  . Smoking status: Never Smoker   . Smokeless tobacco: Never Used  . Alcohol Use: Yes     Comment: socially   OB History    No data available     Review of Systems  All other systems reviewed and are negative.   Allergies   Methadone and Latex  Home Medications   Prior to Admission medications   Medication Sig Start Date End Date Taking? Authorizing Provider  doxylamine, Sleep, (UNISOM) 25 MG tablet Take 25 mg by mouth at bedtime as needed.    Historical Provider, MD  ibuprofen (ADVIL,MOTRIN) 800 MG tablet  07/20/13   Historical Provider, MD  Multiple Vitamin (MULTIVITAMIN) capsule Take 1 capsule by mouth daily.    Historical Provider, MD   BP 120/81 mmHg  Pulse 72  Temp(Src) 97.6 F (36.4 C) (Oral)  Resp 14  Wt 201 lb (91.173 kg)  SpO2 100%  LMP 05/27/2014 (Approximate) Physical Exam  Constitutional: She is oriented to person, place, and time. She appears well-developed and well-nourished.  HENT:  Head: Normocephalic and atraumatic.  Cardiovascular: Normal rate, regular rhythm and normal heart sounds.   Pulmonary/Chest: Effort normal and breath sounds normal.  Discomfort bilaterally with CVA palpation.   Abdominal: Soft.  Hyperactive bowel sounds.  Tenderness over entire abdomen.  Worse with palpation over epigastric area.  No guarding or rebound.    Neurological: She is alert and oriented to person, place, and time.  Skin: Skin is dry.  Psychiatric: She has a normal mood  and affect. Her behavior is normal.    ED Course  Procedures (including critical care time) Labs Review Labs Reviewed  URINE CULTURE  COMPLETE METABOLIC PANEL WITH GFR  LIPASE  CBC WITH DIFFERENTIAL  POCT URINALYSIS DIP (MANUAL ENTRY)  POCT CBC W AUTO DIFF ([facility])    Imaging Review No results found.   MDM   1. Abdominal pain, epigastric   2. Nausea and vomiting, vomiting of unspecified type   3. Abdominal tenderness    CBC(will send out due to difficultly getting blood from vein and possible discrepancies)  WBC 5.8, LY# 3.2, HgB 12.7, Plt 94.  UA- positive for blood and uro. Negative for leuks and nitrates. Hx of blood in urine. Will send for culture. Currently seeing uro-gynocologist for  work up.  Labs pending for cmp and lipase. 20 minutes after GI cocktail no improvement. Pain still rated 8/10 and very nauseated.  Given Zofran 8mg  ODT.  CT scan sent to [facility] due to veins being so hard to obtain.  upt negative. Encouraged pt to drink water on way to imaging.  zofran sent to pharmacy.  protonix sent to start daily for next 5-7 days.  Discussed unclear etiology could be viral gastroenteritis but will pain at 8/10 would like to evaluate for diverticulitis, cholecystitis, appendicitis.   Will call with CT results.     Jomarie LongsJade L Letrice Pollok, PA-C 06/06/14 1021  Jomarie LongsJade L Zsazsa Bahena, PA-C 06/06/14 1022   CT showed faint gallstones. Will refer to general surgery. Pain could be coming from gallstones.   Jomarie LongsJade L Decari Duggar, PA-C 06/06/14 1439

## 2014-06-07 ENCOUNTER — Telehealth: Payer: Self-pay | Admitting: Family Medicine

## 2014-06-07 DIAGNOSIS — K8020 Calculus of gallbladder without cholecystitis without obstruction: Secondary | ICD-10-CM

## 2014-06-07 LAB — CBC WITH DIFFERENTIAL/PLATELET
BASOS ABS: 0 10*3/uL (ref 0.0–0.1)
BASOS PCT: 0 % (ref 0–1)
Eosinophils Absolute: 0.1 10*3/uL (ref 0.0–0.7)
Eosinophils Relative: 2 % (ref 0–5)
HCT: 40.8 % (ref 36.0–46.0)
Hemoglobin: 13.4 g/dL (ref 12.0–15.0)
Lymphocytes Relative: 39 % (ref 12–46)
Lymphs Abs: 1.7 10*3/uL (ref 0.7–4.0)
MCH: 27.7 pg (ref 26.0–34.0)
MCHC: 32.8 g/dL (ref 30.0–36.0)
MCV: 84.5 fL (ref 78.0–100.0)
MONO ABS: 0.4 10*3/uL (ref 0.1–1.0)
MPV: 11.1 fL (ref 9.4–12.4)
Monocytes Relative: 8 % (ref 3–12)
NEUTROS ABS: 2.2 10*3/uL (ref 1.7–7.7)
NEUTROS PCT: 51 % (ref 43–77)
PLATELETS: 160 10*3/uL (ref 150–400)
RBC: 4.83 MIL/uL (ref 3.87–5.11)
RDW: 14.6 % (ref 11.5–15.5)
WBC: 4.4 10*3/uL (ref 4.0–10.5)

## 2014-06-07 LAB — COMPLETE METABOLIC PANEL WITH GFR
ALK PHOS: 104 U/L (ref 39–117)
ALT: 41 U/L — ABNORMAL HIGH (ref 0–35)
AST: 34 U/L (ref 0–37)
Albumin: 3.7 g/dL (ref 3.5–5.2)
BUN: 12 mg/dL (ref 6–23)
CO2: 27 mEq/L (ref 19–32)
CREATININE: 0.83 mg/dL (ref 0.50–1.10)
Calcium: 8.4 mg/dL (ref 8.4–10.5)
Chloride: 104 mEq/L (ref 96–112)
GFR, Est African American: >89 mL/min
GFR, Est Non African American: 85 mL/min
Glucose, Bld: 73 mg/dL (ref 70–99)
Potassium: 4 mEq/L (ref 3.5–5.3)
Sodium: 137 mEq/L (ref 135–145)
Total Bilirubin: 0.7 mg/dL (ref 0.2–1.2)
Total Protein: 7.6 g/dL (ref 6.0–8.3)

## 2014-06-07 LAB — LIPASE: LIPASE: 23 U/L (ref 0–75)

## 2014-06-07 NOTE — Telephone Encounter (Signed)
Low fat diet and ok to enter surgery referral for gallstones

## 2014-06-07 NOTE — Telephone Encounter (Signed)
Pt notified. Referral placed

## 2014-06-07 NOTE — Telephone Encounter (Signed)
Pt is requesting refill to see a surgeon for Gallstones, CT report in EMR. (does not want to use [facility])  Also request to speak with nurse regards to diet she should be eating with the gallstones.

## 2014-06-08 LAB — URINE CULTURE

## 2014-06-09 LAB — POCT CBC W AUTO DIFF (K'VILLE URGENT CARE)

## 2014-06-13 LAB — HM PAP SMEAR: HM Pap smear: NORMAL

## 2014-06-20 ENCOUNTER — Encounter: Payer: Self-pay | Admitting: General Practice

## 2014-06-23 ENCOUNTER — Ambulatory Visit (INDEPENDENT_AMBULATORY_CARE_PROVIDER_SITE_OTHER): Payer: 59 | Admitting: Diagnostic Neuroimaging

## 2014-06-23 ENCOUNTER — Other Ambulatory Visit: Payer: Self-pay | Admitting: Diagnostic Neuroimaging

## 2014-06-23 ENCOUNTER — Encounter: Payer: Self-pay | Admitting: Diagnostic Neuroimaging

## 2014-06-23 VITALS — BP 116/82 | HR 76 | Temp 97.1°F | Ht 65.5 in | Wt 210.6 lb

## 2014-06-23 DIAGNOSIS — G43009 Migraine without aura, not intractable, without status migrainosus: Secondary | ICD-10-CM

## 2014-06-23 DIAGNOSIS — G4700 Insomnia, unspecified: Secondary | ICD-10-CM

## 2014-06-23 MED ORDER — TOPIRAMATE 50 MG PO TABS: 50.0000 mg | ORAL_TABLET | Freq: Every day | ORAL | Status: DC

## 2014-06-23 NOTE — Patient Instructions (Signed)
1. Try melatonin at bedtime 2. Start topiramate 50mg  at bedtime 3. Reduce other sleep aid medications (over the counter) 4. Work on improving physical activity and nutrition  Insomnia Insomnia is frequent trouble falling and/or staying asleep. Insomnia can be a long term problem or a short term problem. Both are common. Insomnia can be a short term problem when the wakefulness is related to a certain stress or worry. Long term insomnia is often related to ongoing stress during waking hours and/or poor sleeping habits. Overtime, sleep deprivation itself can make the problem worse. Every little thing feels more severe because you are overtired and your ability to cope is decreased. CAUSES   Stress, anxiety, and depression.  Poor sleeping habits.  Distractions such as TV in the bedroom.  Naps close to bedtime.  Engaging in emotionally charged conversations before bed.  Technical reading before sleep.  Alcohol and other sedatives. They may make the problem worse. They can hurt normal sleep patterns and normal dream activity.  Stimulants such as caffeine for several hours prior to bedtime.  Pain syndromes and shortness of breath can cause insomnia.  Exercise late at night.  Changing time zones may cause sleeping problems (jet lag). It is sometimes helpful to have someone observe your sleeping patterns. They should look for periods of not breathing during the night (sleep apnea). They should also look to see how long those periods last. If you live alone or observers are uncertain, you can also be observed at a sleep clinic where your sleep patterns will be professionally monitored. Sleep apnea requires a checkup and treatment. Give your caregivers your medical history. Give your caregivers observations your family has made about your sleep.  SYMPTOMS   Not feeling rested in the morning.  Anxiety and restlessness at bedtime.  Difficulty falling and staying asleep. TREATMENT   Your  caregiver may prescribe treatment for an underlying medical disorders. Your caregiver can give advice or help if you are using alcohol or other drugs for self-medication. Treatment of underlying problems will usually eliminate insomnia problems.  Medications can be prescribed for short time use. They are generally not recommended for lengthy use.  Over-the-counter sleep medicines are not recommended for lengthy use. They can be habit forming.  You can promote easier sleeping by making lifestyle changes such as:  Using relaxation techniques that help with breathing and reduce muscle tension.  Exercising earlier in the day.  Changing your diet and the time of your last meal. No night time snacks.  Establish a regular time to go to bed.  Counseling can help with stressful problems and worry.  Soothing music and white noise may be helpful if there are background noises you cannot remove.  Stop tedious detailed work at least one hour before bedtime. HOME CARE INSTRUCTIONS   Keep a diary. Inform your caregiver about your progress. This includes any medication side effects. See your caregiver regularly. Take note of:  Times when you are asleep.  Times when you are awake during the night.  The quality of your sleep.  How you feel the next day. This information will help your caregiver care for you.  Get out of bed if you are still awake after 15 minutes. Read or do some quiet activity. Keep the lights down. Wait until you feel sleepy and go back to bed.  Keep regular sleeping and waking hours. Avoid naps.  Exercise regularly.  Avoid distractions at bedtime. Distractions include watching television or engaging in any intense or  detailed activity like attempting to balance the household checkbook.  Develop a bedtime ritual. Keep a familiar routine of bathing, brushing your teeth, climbing into bed at the same time each night, listening to soothing music. Routines increase the success  of falling to sleep faster.  Use relaxation techniques. This can be using breathing and muscle tension release routines. It can also include visualizing peaceful scenes. You can also help control troubling or intruding thoughts by keeping your mind occupied with boring or repetitive thoughts like the old concept of counting sheep. You can make it more creative like imagining planting one beautiful flower after another in your backyard garden.  During your day, work to eliminate stress. When this is not possible use some of the previous suggestions to help reduce the anxiety that accompanies stressful situations. MAKE SURE YOU:   Understand these instructions.  Will watch your condition.  Will get help right away if you are not doing well or get worse. Document Released: 07/04/2000 Document Revised: 09/29/2011 Document Reviewed: 08/04/2007 [facility] Patient Information 2015 [place]. This information is not intended to replace advice given to you by your health care provider. Make sure you discuss any questions you have with your health care provider.
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
## 2014-07-12 ENCOUNTER — Encounter: Payer: Self-pay | Admitting: Family Medicine

## 2014-07-12 ENCOUNTER — Other Ambulatory Visit: Payer: Self-pay | Admitting: Family Medicine

## 2014-07-12 ENCOUNTER — Ambulatory Visit (INDEPENDENT_AMBULATORY_CARE_PROVIDER_SITE_OTHER): Payer: 59 | Admitting: Family Medicine

## 2014-07-12 ENCOUNTER — Ambulatory Visit: Payer: 59 | Admitting: Family Medicine

## 2014-07-12 VITALS — BP 120/80 | HR 83 | Temp 98.3°F | Resp 16 | Wt 209.6 lb

## 2014-07-12 DIAGNOSIS — G4700 Insomnia, unspecified: Secondary | ICD-10-CM

## 2014-07-12 DIAGNOSIS — R140 Abdominal distension (gaseous): Secondary | ICD-10-CM

## 2014-07-12 DIAGNOSIS — K5901 Slow transit constipation: Secondary | ICD-10-CM | POA: Insufficient documentation

## 2014-07-12 MED ORDER — SENNOSIDES-DOCUSATE SODIUM 8.6-50 MG PO TABS
2.0000 | ORAL_TABLET | Freq: Every evening | ORAL | Status: DC | PRN
Start: 2014-07-12 — End: 2015-05-10

## 2014-07-12 MED ORDER — TRAZODONE HCL 50 MG PO TABS: 25.0000 mg | ORAL_TABLET | Freq: Every evening | ORAL | Status: DC | PRN

## 2014-07-12 NOTE — Telephone Encounter (Signed)
Med filled.  

## 2014-07-12 NOTE — Progress Notes (Signed)
Pre visit review using our clinic review tool, if applicable. No additional management support is needed unless otherwise documented below in the visit note. 

## 2014-07-12 NOTE — Patient Instructions (Signed)
Follow up as needed Start the Senokot-S 1-2 tabs nightly for constipation Continue your Melatonin nightly for sleep- add the Trazodone as needed Drink plenty of fluids Gas-X as needed for bloating and discomfort Call with any questions or concerns Happy Holidays!!!

## 2014-07-12 NOTE — Progress Notes (Signed)
   Subjective:    Patient ID: Olivia Simon, female    DOB: 08/02/1970, 43 y.o.   MRN: [id number]  HPI Gas/Bloating- pt has hx of GERD, constipation.  sxs recurred ~1 week ago after having cystoscopy.  Increased abdominal gurgling, gas, belching, constipation.  Taking Probiotics and Digestive Enzymes from healthy food store w/o relief.  Taking Miralax qAM and metamucil QHS w/o relief.  Increased water intake.  Insomnia- chronic problem for pt, previously on Unisom.  Minimal relief w/ this.  Added Melatonin which has not made a difference.    Review of Systems For ROS see HPI     Objective:   Physical Exam  Constitutional: She is oriented to person, place, and time. She appears well-developed and well-nourished. No distress.  HENT:  Head: Normocephalic and atraumatic.  Eyes: Conjunctivae and EOM are normal. Pupils are equal, round, and reactive to light.  Neck: Normal range of motion. Neck supple. No thyromegaly present.  Cardiovascular: Normal rate, regular rhythm, normal heart sounds and intact distal pulses.   No murmur heard. Pulmonary/Chest: Effort normal and breath sounds normal. No respiratory distress.  Abdominal: Soft. She exhibits no distension. There is no tenderness.  Musculoskeletal: She exhibits no edema.  Lymphadenopathy:    She has no cervical adenopathy.  Neurological: She is alert and oriented to person, place, and time.  Skin: Skin is warm and dry.  Psychiatric: She has a normal mood and affect. Her behavior is normal.  Vitals reviewed.         Assessment & Plan:

## 2014-07-16 NOTE — Assessment & Plan Note (Signed)
Deteriorated.  No relief w/ Melatonin.  Start trazodone prn.  Pt expressed understanding and is in agreement w/ plan.

## 2014-07-16 NOTE — Assessment & Plan Note (Signed)
Deteriorated.  No relief w/ Miralax.  Start Senokot-S.  Pt expressed understanding and is in agreement w/ plan.

## 2014-07-16 NOTE — Assessment & Plan Note (Signed)
New.  Suspect this is due to pt's poor diet and lack of exercise.  Start Senokot S as needed for constipation.  Encouraged increased fluids, exercise, and fiber.  Reviewed supportive care and red flags that should prompt return.  Pt expressed understanding and is in agreement w/ plan.

## 2014-07-26 ENCOUNTER — Encounter: Payer: 59 | Admitting: Family Medicine

## 2014-08-18 ENCOUNTER — Telehealth: Payer: Self-pay | Admitting: Family Medicine

## 2014-08-18 DIAGNOSIS — K219 Gastro-esophageal reflux disease without esophagitis: Secondary | ICD-10-CM

## 2014-08-18 DIAGNOSIS — K5909 Other constipation: Secondary | ICD-10-CM

## 2014-08-18 LAB — HM MAMMOGRAPHY

## 2014-08-18 NOTE — Telephone Encounter (Signed)
Ok to refer to GI

## 2014-08-18 NOTE — Telephone Encounter (Signed)
Referral placed.

## 2014-08-18 NOTE — Telephone Encounter (Signed)
Caller name: Rodell Pernaatrice Relation to pt: self Call back number: [phone] or [phone] Pharmacy: [facility] on [address]  Reason for call:   Patient states that the constipation medication that Dr. Beverely Lowabori prescribed is not working for her. She states that it takes about five days before medication is effective. She would also like a referral to GI.

## 2014-08-29 ENCOUNTER — Encounter: Payer: Self-pay | Admitting: General Practice

## 2014-09-21 ENCOUNTER — Ambulatory Visit: Payer: Self-pay | Admitting: Internal Medicine

## 2014-11-08 ENCOUNTER — Other Ambulatory Visit: Payer: Self-pay | Admitting: Obstetrics and Gynecology

## 2014-11-09 ENCOUNTER — Telehealth: Payer: Self-pay | Admitting: *Deleted

## 2014-11-09 ENCOUNTER — Encounter (HOSPITAL_COMMUNITY): Payer: Self-pay | Admitting: *Deleted

## 2014-11-09 NOTE — Telephone Encounter (Signed)
Pt dropped off verification form needed for [employer]. Form filled out as much and possible and forwarded to Dr. Beverely Lowabori. JG//CMA

## 2014-11-13 ENCOUNTER — Telehealth: Payer: Self-pay | Admitting: Family Medicine

## 2014-11-13 NOTE — Telephone Encounter (Signed)
Patient returned phone call. Call cell# [phone] before 3pm after 3pm please call work# [phone]

## 2014-11-13 NOTE — Telephone Encounter (Signed)
Have ou finished these?
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
## 2014-11-13 NOTE — Telephone Encounter (Signed)
Just got forms on Friday- have not yet had time to complete

## 2014-11-13 NOTE — Telephone Encounter (Signed)
I know that it's for the metal plate in her head but I have no documentation that this impacts her school work in any way.  That's what the psycho-educational assessment would be for- to determine how this impacts her learning and what accommodations she would require.  This is done by a psychologist in the area

## 2014-11-13 NOTE — Telephone Encounter (Signed)
Called and left message for pt to return call. JG//CMA

## 2014-11-13 NOTE — Telephone Encounter (Signed)
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  SHE WANTS TO KNOW WHEN SHE CAN PICK THEM UP  PLEASE CALL HER

## 2014-11-13 NOTE — Telephone Encounter (Signed)
Pt states paperwork is for the surgery that she had on her head. Pt would also like to know what the psycho-educational assessment would entail and what type of doctor would perform that. Please advise.

## 2014-11-14 NOTE — Telephone Encounter (Signed)
Caller name: Deignan,Tamakia Relation to pt: self  Call back number: best # (work) [phone]   Reason for call:  Pt returning your call

## 2014-11-15 ENCOUNTER — Other Ambulatory Visit (HOSPITAL_COMMUNITY): Payer: Self-pay | Admitting: Obstetrics and Gynecology

## 2014-11-15 NOTE — Telephone Encounter (Signed)
Returned pt's call and unable to leave message.  [facility]
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
## 2014-11-15 NOTE — Telephone Encounter (Signed)
Spoke with patient and discussed paperwork per PCP notes. Patient states that [facility] to [facility] to [facility] for verification. States that we do not need to fill out the form.   Gave paperwork to MattelJess G

## 2014-11-29 MED ORDER — CEFAZOLIN SODIUM-DEXTROSE 2-3 GM-% IV SOLR
2.0000 g | INTRAVENOUS | Status: AC
Start: 2014-11-30 — End: 2014-11-30
  Administered 2014-11-30: 2 g via INTRAVENOUS

## 2014-11-30 ENCOUNTER — Observation Stay (HOSPITAL_COMMUNITY)
Admission: RE | Admit: 2014-11-30 | Discharge: 2014-12-01 | Disposition: A | Discharge status: Home / Self Care | Payer: 59 | Source: Ambulatory Visit | Attending: Obstetrics and Gynecology | Admitting: Obstetrics and Gynecology

## 2014-11-30 ENCOUNTER — Ambulatory Visit (HOSPITAL_COMMUNITY): Payer: 59 | Admitting: Anesthesiology

## 2014-11-30 ENCOUNTER — Encounter (HOSPITAL_COMMUNITY): Payer: Self-pay | Admitting: Emergency Medicine

## 2014-11-30 ENCOUNTER — Encounter (HOSPITAL_COMMUNITY)
Admission: RE | Disposition: A | Discharge status: Home / Self Care | Payer: Self-pay | Source: Ambulatory Visit | Attending: Obstetrics and Gynecology

## 2014-11-30 DIAGNOSIS — K219 Gastro-esophageal reflux disease without esophagitis: Secondary | ICD-10-CM | POA: Diagnosis not present

## 2014-11-30 DIAGNOSIS — K5900 Constipation, unspecified: Secondary | ICD-10-CM | POA: Insufficient documentation

## 2014-11-30 DIAGNOSIS — Z888 Allergy status to other drugs, medicaments and biological substances status: Secondary | ICD-10-CM | POA: Insufficient documentation

## 2014-11-30 DIAGNOSIS — N3281 Overactive bladder: Secondary | ICD-10-CM | POA: Diagnosis not present

## 2014-11-30 DIAGNOSIS — N819 Female genital prolapse, unspecified: Secondary | ICD-10-CM | POA: Diagnosis present

## 2014-11-30 DIAGNOSIS — N8110 Cystocele, unspecified: Secondary | ICD-10-CM | POA: Diagnosis present

## 2014-11-30 DIAGNOSIS — N816 Rectocele: Secondary | ICD-10-CM | POA: Diagnosis present

## 2014-11-30 DIAGNOSIS — N8189 Other female genital prolapse: Secondary | ICD-10-CM | POA: Diagnosis not present

## 2014-11-30 DIAGNOSIS — Z91040 Latex allergy status: Secondary | ICD-10-CM | POA: Diagnosis not present

## 2014-11-30 DIAGNOSIS — G4700 Insomnia, unspecified: Secondary | ICD-10-CM | POA: Diagnosis not present

## 2014-11-30 DIAGNOSIS — IMO0002 Reserved for concepts with insufficient information to code with codable children: Secondary | ICD-10-CM

## 2014-11-30 HISTORY — PX: ANTERIOR AND POSTERIOR REPAIR: SHX5121

## 2014-11-30 HISTORY — DX: Calculus of gallbladder without cholecystitis without obstruction: K80.20

## 2014-11-30 HISTORY — PX: CYSTOSCOPY: SHX5120

## 2014-11-30 HISTORY — DX: Personal history of other medical treatment: Z92.89

## 2014-11-30 HISTORY — DX: Insomnia, unspecified: G47.00

## 2014-11-30 LAB — CBC
HCT: 39.9 % (ref 36.0–46.0)
Hemoglobin: 13 g/dL (ref 12.0–15.0)
MCH: 28.2 pg (ref 26.0–34.0)
MCHC: 32.6 g/dL (ref 30.0–36.0)
MCV: 86.6 fL (ref 78.0–100.0)
Platelets: 174 10*3/uL (ref 150–400)
RBC: 4.61 MIL/uL (ref 3.87–5.11)
RDW: 14.4 % (ref 11.5–15.5)
WBC: 5.1 10*3/uL (ref 4.0–10.5)

## 2014-11-30 LAB — PREGNANCY, URINE: PREG TEST UR: NEGATIVE

## 2014-11-30 SURGERY — ANTERIOR (CYSTOCELE) AND POSTERIOR REPAIR (RECTOCELE)
Anesthesia: General | Site: Vagina

## 2014-11-30 MED ORDER — MENTHOL 3 MG MT LOZG: 1.0000 | LOZENGE | OROMUCOSAL | Status: DC | PRN

## 2014-11-30 MED ORDER — KETOROLAC TROMETHAMINE 30 MG/ML IJ SOLN
30.0000 mg | Freq: Four times a day (QID) | INTRAMUSCULAR | Status: DC
  Administered 2014-11-30 – 2014-12-01 (×3): 30 mg via INTRAVENOUS
  Filled 2014-11-30 (×4): qty 1

## 2014-11-30 MED ORDER — ACETAMINOPHEN 160 MG/5ML PO SOLN
ORAL | Status: AC
  Administered 2014-11-30: 975 mg via ORAL
  Filled 2014-11-30: qty 20.3

## 2014-11-30 MED ORDER — MIDAZOLAM HCL 2 MG/2ML IJ SOLN
INTRAMUSCULAR | Status: AC
  Filled 2014-11-30: qty 2

## 2014-11-30 MED ORDER — HYDROMORPHONE HCL 1 MG/ML IJ SOLN
INTRAMUSCULAR | Status: AC
  Filled 2014-11-30: qty 1

## 2014-11-30 MED ORDER — PROPOFOL 10 MG/ML IV BOLUS
INTRAVENOUS | Status: AC
Start: 2014-11-30 — End: 2014-11-30
  Filled 2014-11-30: qty 20

## 2014-11-30 MED ORDER — ESTRADIOL 0.1 MG/GM VA CREA
TOPICAL_CREAM | VAGINAL | Status: AC
  Filled 2014-11-30: qty 42.5

## 2014-11-30 MED ORDER — ONDANSETRON HCL 4 MG/2ML IJ SOLN
INTRAMUSCULAR | Status: DC | PRN
  Administered 2014-11-30: 4 mg via INTRAVENOUS

## 2014-11-30 MED ORDER — KETOROLAC TROMETHAMINE 30 MG/ML IJ SOLN
INTRAMUSCULAR | Status: AC
  Filled 2014-11-30: qty 1

## 2014-11-30 MED ORDER — DEXAMETHASONE SODIUM PHOSPHATE 4 MG/ML IJ SOLN
INTRAMUSCULAR | Status: AC
Start: 2014-11-30 — End: 2014-11-30
  Filled 2014-11-30: qty 1

## 2014-11-30 MED ORDER — PROPOFOL 10 MG/ML IV BOLUS
INTRAVENOUS | Status: DC | PRN
  Administered 2014-11-30: 200 mg via INTRAVENOUS

## 2014-11-30 MED ORDER — KETOROLAC TROMETHAMINE 30 MG/ML IJ SOLN
INTRAMUSCULAR | Status: DC | PRN
  Administered 2014-11-30: 30 mg via INTRAVENOUS

## 2014-11-30 MED ORDER — NEOSTIGMINE METHYLSULFATE 10 MG/10ML IV SOLN
INTRAVENOUS | Status: AC
  Filled 2014-11-30: qty 1

## 2014-11-30 MED ORDER — GLYCOPYRROLATE 0.2 MG/ML IJ SOLN
INTRAMUSCULAR | Status: AC
  Filled 2014-11-30: qty 3

## 2014-11-30 MED ORDER — FENTANYL CITRATE (PF) 100 MCG/2ML IJ SOLN
INTRAMUSCULAR | Status: DC | PRN
  Administered 2014-11-30 (×5): 50 ug via INTRAVENOUS

## 2014-11-30 MED ORDER — MIDAZOLAM HCL 5 MG/5ML IJ SOLN
INTRAMUSCULAR | Status: DC | PRN
  Administered 2014-11-30: 2 mg via INTRAVENOUS

## 2014-11-30 MED ORDER — CEFAZOLIN SODIUM-DEXTROSE 2-3 GM-% IV SOLR
INTRAVENOUS | Status: AC
  Filled 2014-11-30: qty 50

## 2014-11-30 MED ORDER — ESTRADIOL 0.1 MG/GM VA CREA
TOPICAL_CREAM | VAGINAL | Status: DC | PRN
  Administered 2014-11-30: 1 via VAGINAL

## 2014-11-30 MED ORDER — GLYCOPYRROLATE 0.2 MG/ML IJ SOLN
INTRAMUSCULAR | Status: DC | PRN
  Administered 2014-11-30 (×2): 0.1 mg via INTRAVENOUS

## 2014-11-30 MED ORDER — VASOPRESSIN 20 UNIT/ML IV SOLN
INTRAVENOUS | Status: DC | PRN
  Administered 2014-11-30: 40 mL via INTRAMUSCULAR

## 2014-11-30 MED ORDER — LACTATED RINGERS IV SOLN
INTRAVENOUS | Status: DC
  Administered 2014-11-30 (×2): via INTRAVENOUS

## 2014-11-30 MED ORDER — ONDANSETRON HCL 4 MG/2ML IJ SOLN
INTRAMUSCULAR | Status: AC
  Filled 2014-11-30: qty 2

## 2014-11-30 MED ORDER — FENTANYL CITRATE (PF) 250 MCG/5ML IJ SOLN
INTRAMUSCULAR | Status: AC
  Filled 2014-11-30: qty 5

## 2014-11-30 MED ORDER — IBUPROFEN 600 MG PO TABS
600.0000 mg | ORAL_TABLET | Freq: Four times a day (QID) | ORAL | Status: DC | PRN
  Administered 2014-12-01: 600 mg via ORAL
  Filled 2014-11-30: qty 1

## 2014-11-30 MED ORDER — LIDOCAINE HCL (CARDIAC) 20 MG/ML IV SOLN
INTRAVENOUS | Status: AC
  Filled 2014-11-30: qty 5

## 2014-11-30 MED ORDER — OXYCODONE-ACETAMINOPHEN 5-325 MG PO TABS
1.0000 | ORAL_TABLET | ORAL | Status: DC | PRN
  Administered 2014-11-30: 1 via ORAL
  Filled 2014-11-30: qty 1

## 2014-11-30 MED ORDER — LACTATED RINGERS IV SOLN
INTRAVENOUS | Status: DC
  Administered 2014-11-30 (×2): via INTRAVENOUS

## 2014-11-30 MED ORDER — DEXAMETHASONE SODIUM PHOSPHATE 10 MG/ML IJ SOLN
INTRAMUSCULAR | Status: DC | PRN
  Administered 2014-11-30: 4 mg via INTRAVENOUS

## 2014-11-30 MED ORDER — SCOPOLAMINE 1 MG/3DAYS TD PT72
1.0000 | MEDICATED_PATCH | Freq: Once | TRANSDERMAL | Status: DC
  Administered 2014-11-30: 1.5 mg via TRANSDERMAL

## 2014-11-30 MED ORDER — ACETAMINOPHEN 160 MG/5ML PO SOLN
975.0000 mg | Freq: Once | ORAL | Status: AC
  Administered 2014-11-30: 975 mg via ORAL

## 2014-11-30 MED ORDER — ACETAMINOPHEN 160 MG/5ML PO SOLN
ORAL | Status: AC
  Filled 2014-11-30: qty 20.3

## 2014-11-30 MED ORDER — HYDROMORPHONE HCL 1 MG/ML IJ SOLN
0.2500 mg | INTRAMUSCULAR | Status: DC | PRN
  Administered 2014-11-30: 0.5 mg via INTRAVENOUS

## 2014-11-30 MED ORDER — GLYCOPYRROLATE 0.2 MG/ML IJ SOLN
INTRAMUSCULAR | Status: AC
  Filled 2014-11-30: qty 1

## 2014-11-30 MED ORDER — LIDOCAINE HCL (CARDIAC) 20 MG/ML IV SOLN
INTRAVENOUS | Status: DC | PRN
  Administered 2014-11-30: 100 mg via INTRAVENOUS

## 2014-11-30 MED ORDER — VASOPRESSIN 20 UNIT/ML IV SOLN
INTRAVENOUS | Status: AC
  Filled 2014-11-30: qty 1

## 2014-11-30 MED ORDER — SCOPOLAMINE 1 MG/3DAYS TD PT72
MEDICATED_PATCH | TRANSDERMAL | Status: DC
Start: 2014-11-30 — End: 2014-12-01
  Administered 2014-11-30: 1.5 mg via TRANSDERMAL
  Filled 2014-11-30: qty 1

## 2014-11-30 MED ORDER — ONDANSETRON HCL 4 MG PO TABS: 4.0000 mg | ORAL_TABLET | Freq: Three times a day (TID) | ORAL | Status: DC | PRN

## 2014-11-30 MED ORDER — SODIUM CHLORIDE 0.9 % IJ SOLN
INTRAMUSCULAR | Status: AC
  Filled 2014-11-30: qty 50

## 2014-11-30 SURGICAL SUPPLY — 29 items
BLADE SURG 15 STRL LF C SS BP (BLADE) ×2 IMPLANT
BLADE SURG 15 STRL SS (BLADE) ×1
CANISTER SUCT 3000ML (MISCELLANEOUS) ×3 IMPLANT
CLOTH BEACON ORANGE TIMEOUT ST (SAFETY) ×3 IMPLANT
CONT PATH 16OZ SNAP LID 3702 (MISCELLANEOUS) IMPLANT
DECANTER SPIKE VIAL GLASS SM (MISCELLANEOUS) IMPLANT
DRAPE SHEET LG 3/4 BI-LAMINATE (DRAPES) ×6 IMPLANT
DRAPE STERI URO 9X17 APER PCH (DRAPES) ×3 IMPLANT
GAUZE PACKING 2X5 YD STRL (GAUZE/BANDAGES/DRESSINGS) ×3 IMPLANT
GLOVE BIO SURGEON STRL SZ7.5 (GLOVE) ×3 IMPLANT
GLOVE BIOGEL PI IND STRL 7.5 (GLOVE) ×2 IMPLANT
GLOVE BIOGEL PI INDICATOR 7.5 (GLOVE) ×1
GOWN STRL REUS W/TWL LRG LVL3 (GOWN DISPOSABLE) ×12 IMPLANT
NEEDLE HYPO 22GX1.5 SAFETY (NEEDLE) ×3 IMPLANT
NS IRRIG 1000ML POUR BTL (IV SOLUTION) ×3 IMPLANT
PACK VAGINAL WOMENS (CUSTOM PROCEDURE TRAY) ×3 IMPLANT
SET CYSTO W/LG BORE CLAMP LF (SET/KITS/TRAYS/PACK) ×3 IMPLANT
SUT VIC AB 0 CT1 18XCR BRD8 (SUTURE) ×2 IMPLANT
SUT VIC AB 0 CT1 8-18 (SUTURE) ×1
SUT VIC AB 1 CT1 36 (SUTURE) IMPLANT
SUT VIC AB 2-0 CT1 27 (SUTURE) ×2
SUT VIC AB 2-0 CT1 TAPERPNT 27 (SUTURE) ×4 IMPLANT
SUT VIC AB 2-0 SH 27 (SUTURE) ×8
SUT VIC AB 2-0 SH 27XBRD (SUTURE) ×16 IMPLANT
SUT VIC AB 3-0 SH 27 (SUTURE)
SUT VIC AB 3-0 SH 27X BRD (SUTURE) IMPLANT
TOWEL OR 17X24 6PK STRL BLUE (TOWEL DISPOSABLE) ×6 IMPLANT
TRAY FOLEY CATH SILVER 14FR (SET/KITS/TRAYS/PACK) ×3 IMPLANT
WATER STERILE IRR 1000ML POUR (IV SOLUTION) IMPLANT

## 2014-11-30 NOTE — Anesthesia Preprocedure Evaluation (Addendum)
Anesthesia Evaluation  Patient identified by MRN, date of birth, ID band Patient awake    Reviewed: Allergy & Precautions, H&P , Patient's Chart, lab work & pertinent test results, reviewed documented beta blocker date and time   Airway Mallampati: II  TM Distance: >3 FB Neck ROM: full    Dental no notable dental hx.    Pulmonary  breath sounds clear to auscultation  Pulmonary exam normal       Cardiovascular Rhythm:regular Rate:Normal     Neuro/Psych    GI/Hepatic GERD-  Medicated and Controlled,  Endo/Other    Renal/GU      Musculoskeletal   Abdominal   Peds  Hematology   Anesthesia Other Findings   Reproductive/Obstetrics                             Anesthesia Physical  Anesthesia Plan  ASA: II  Anesthesia Plan: General   Post-op Pain Management:    Induction: Intravenous  Airway Management Planned: LMA  Additional Equipment:   Intra-op Plan:   Post-operative Plan: Extubation in OR  Informed Consent: I have reviewed the patients History and Physical, chart, labs and discussed the procedure including the risks, benefits and alternatives for the proposed anesthesia with the patient or authorized representative who has indicated his/her understanding and acceptance.   Dental Advisory Given  Plan Discussed with: CRNA and Surgeon  Anesthesia Plan Comments: (  Discussed regional and  general anesthesia, including possible nausea, instrumentation of airway, sore throat,pulmonary aspiration, etc. I asked if the were any outstanding questions, or  concerns before we proceeded. We will start with LMA. )       Anesthesia Quick Evaluation

## 2014-11-30 NOTE — Anesthesia Procedure Notes (Signed)
Procedure Name: LMA Insertion Date/Time: 11/30/2014 9:36 AM Performed by: Elgie CongoMALINOVA, Metztli Sachdev H Pre-anesthesia Checklist: Patient being monitored, Emergency Drugs available, Suction available and Patient identified Patient Re-evaluated:Patient Re-evaluated prior to inductionOxygen Delivery Method: Circle system utilized Preoxygenation: Pre-oxygenation with 100% oxygen Intubation Type: IV induction LMA: LMA inserted LMA Size: 4.0 Number of attempts: 1 Placement Confirmation: positive ETCO2 and breath sounds checked- equal and bilateral Tube secured with: Tape Dental Injury: Teeth and Oropharynx as per pre-operative assessment

## 2014-11-30 NOTE — Op Note (Signed)
Preop Diagnosis: Cystocele and Rectocele  Postop Diagnosis: Cystocele and Rectocele  Procedure: ANTERIOR AND POSTERIOR REPAIR   Anesthesia: General   Attending: Osborn CohoAngela Chlora Mcbain, MD   Assistant: Henreitta LeberElmira Powell, PA-C  Findings: Cystocele and rectocele   Pathology: N/a  Fluids: See flowsheet  UOP: See flowsheet  EBL: 100cc  Complications: None  Procedure: The patient was taken to the operating room after the risks, benefits and alternatives were discussed with the patient, the patient verbalized understanding and consent signed and witnessed.  A timeout was performed per protocol.  The patient was prepped and draped in the normal sterile fashion in the dorsal lithotomy position and the patient was placed under general anesthesia per the anesthesiologist. A weighted speculum was placed in the patient's vagina and the anterior vaginal wall was retracted using Deaver retractors.  The anterior vaginal wall was injected with dilute pitressin and the anterior vaginal wall was then incised and dissected away from the underlying layer of tissue.  The cystocele was repaired with plication stitches.  The excess vaginal wall tissue was excised and repaired with interrupted stitches of 2-0 Vicryl.  Attention was then turned to the posterior vaginal wall where dilute Pitressin was injected. The posterior vaginal wall was incised and the underlying tissue was dissected away from the posterior vaginal wall. The rectocele was repaired using plication stitches of 2-0 Vicryl.  Excess posterior vaginal wall tissue was excised and the posterior vaginal wall repaired with 2-0 vicryl via a running interlocking stitch.  The perineum was repaired with 2-0 Vicryl via a subcuticular stitch. Cystoscopy was performed and bilateral ureters were noted to efflux without difficulty and there were no inadvertent bladder injuriesnoted.  The vagina was packed with estrogen-soaked packing.  The patient tolerated the  procedure well and was awaiting return to the recovery room in good condition.
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
## 2014-11-30 NOTE — Transfer of Care (Signed)
Immediate Anesthesia Transfer of Care Note  Patient: Olivia HaroldPatrice L Iwasaki  Procedure(s) Performed: Procedure(s): ANTERIOR (CYSTOCELE) AND POSTERIOR REPAIR (RECTOCELE) (N/A) CYSTOSCOPY  Patient Location: PACU  Anesthesia Type:General  Level of Consciousness: awake, alert  and oriented  Airway & Oxygen Therapy: Patient Spontanous Breathing and Patient connected to nasal cannula oxygen  Post-op Assessment: Report given to RN, Post -op Vital signs reviewed and stable and Patient moving all extremities  Post vital signs: Reviewed and stable  Last Vitals:  Filed Vitals:   11/30/14 0844  BP: 121/80  Pulse: 89  Temp: 36.5 C  Resp: 16    Complications: No apparent anesthesia complications

## 2014-11-30 NOTE — Interval H&P Note (Signed)
History and Physical Interval Note:  11/30/2014 9:25 AM  Olivia HaroldPatrice L Sedlak  has presented today for surgery, with the diagnosis of Cystocele  The various methods of treatment have been discussed with the patient and family. After consideration of risks, benefits and other options for treatment, the patient has consented to  Procedure(s): ANTERIOR (SYMPTOMATIC CYSTOCELE) AND POSTERIOR REPAIR (SYMPTOMATIC RECTOCELE) (N/A) as a surgical intervention .  The patient's history has been reviewed, patient examined, no change in status, stable for surgery.  I have reviewed the patient's chart and labs.  Questions were answered to the patient's satisfaction.     [name],[name]

## 2014-12-01 ENCOUNTER — Encounter (HOSPITAL_COMMUNITY): Payer: Self-pay | Admitting: Obstetrics and Gynecology

## 2014-12-01 DIAGNOSIS — N816 Rectocele: Secondary | ICD-10-CM | POA: Diagnosis not present

## 2014-12-01 LAB — CBC
HEMATOCRIT: 33.2 % — AB (ref 36.0–46.0)
Hemoglobin: 10.9 g/dL — ABNORMAL LOW (ref 12.0–15.0)
MCH: 27.8 pg (ref 26.0–34.0)
MCHC: 32.8 g/dL (ref 30.0–36.0)
MCV: 84.7 fL (ref 78.0–100.0)
PLATELETS: 165 10*3/uL (ref 150–400)
RBC: 3.92 MIL/uL (ref 3.87–5.11)
RDW: 14.1 % (ref 11.5–15.5)
WBC: 9.4 10*3/uL (ref 4.0–10.5)

## 2014-12-01 MED ORDER — IBUPROFEN 600 MG PO TABS: ORAL_TABLET | ORAL | Status: DC

## 2014-12-01 MED ORDER — OXYCODONE-ACETAMINOPHEN 5-325 MG PO TABS: 1.0000 | ORAL_TABLET | Freq: Four times a day (QID) | ORAL | Status: DC | PRN

## 2014-12-01 NOTE — Discharge Instructions (Signed)
Call [facility] @ [phone] if:  You have a temperature greater than or equal to 100.4 degrees Farenheit orally You have pain that is not made better by the pain medication given and taken as directed You have excessive bleeding or problems urinating  Take Colace (Docusate Sodium/Stool Softener) 100 mg 2-3 times daily while taking narcotic pain medicine to avoid constipation or until bowel movements are regular. Take Ibuprofen 600 mg with food every 6 hours for 5 days then as needed for pain  You may drive after 1 week You may walk up steps  You may shower  You may resume a regular diet  Do not lift over 15 pounds for 6 weeks Avoid anything in vagina for 6 weeks

## 2014-12-01 NOTE — Discharge Summary (Signed)
Physician Discharge Summary  Patient ID: Olivia Simon MRN: [id number] DOB/AGE: 47/03/1971 44 y.o.  Admit date: 11/30/2014 Discharge date: 12/01/2014   Discharge Diagnoses: Symptomatic Pelvic Relaxation Active Problems:   Cystocele   Rectocele   Pelvic prolapse   Operation:  Anteriior-Posterior Colporrhaphy with Cystoscopy   Discharged Condition: Good  Hospital Course: On the date of admission the patient underwent the aforementioned procedures and tolerated them well.  Post operative course was unremarkable with the patient resuming bowel and bladder function by post operative day #1 and therefore deemed ready for discharge home.  Discharge hemoglobin and hematocrit  were 10.9/33.2 (pre-op hemoglobin and hematocrit were 13.0/39.9) Disposition: 01-Home or Self Care  Discharge Medications:    Medication List    STOP taking these medications        oxybutynin 5 MG 24 hr tablet  Commonly known as:  DITROPAN-XL     traZODone 50 MG tablet  Commonly known as:  DESYREL      TAKE these medications        alum & mag hydroxide-simeth 200-200-20 MG/5ML suspension  Commonly known as:  MAALOX/MYLANTA  Take 30 mLs by mouth every 6 (six) hours as needed for indigestion or heartburn.     ibuprofen 600 MG tablet  Commonly known as:  ADVIL,MOTRIN  1  po  pc every 6 hours for 5 days then prn-pain     multivitamin capsule  Take 1 capsule by mouth daily.     oxyCODONE-acetaminophen 5-325 MG per tablet  Commonly known as:  PERCOCET/ROXICET  Take 1-2 tablets by mouth every 6 (six) hours as needed for severe pain (moderate to severe pain (when tolerating fluids)).     pantoprazole 40 MG tablet  Commonly known as:  PROTONIX  Take 1 tablet (40 mg total) by mouth daily.     polyethylene glycol packet  Commonly known as:  MIRALAX / GLYCOLAX  Take 17 g by mouth daily as needed for mild constipation.     RA MELATONIN 10 MG Tabs  Generic drug:  Melatonin  Take 1 tablet by mouth at  bedtime.     senna-docusate 8.6-50 MG per tablet  Commonly known as:  SENOKOT S  Take 2 tablets by mouth at bedtime as needed for mild constipation.     topiramate 50 MG tablet  Commonly known as:  TOPAMAX  TAKE 1 TABLET BY MOUTH EVERY NIGHT AT BEDTIME           Follow-up: Dr. Woodroe ModeAngela Y. Su Hiltoberts on Dec 14, 2014 at 10:30 a.m.   SignedHenreitta Leber: POWELL,ELMIRA , PA-C  12/01/2014, 8:00 AM

## 2014-12-01 NOTE — Progress Notes (Signed)
Olivia Simon is [age].o.  [id number]  Post Op Date # 1:  Anteriior-Posterior Colporrhaphy/Cystoscopy  Subjective: Patient is Doing well postoperatively. Patient has good pain control with Toradol.    Is ambulating without  difficulty or lightheadedness, tolerating soft foods and liquids but hasn't voided yet.  Objective: Vital signs in last 24 hours: Temp:  [97.5 F (36.4 C)-99.2 F (37.3 C)] 98.8 F (37.1 C) (05/13 0643) Pulse Rate:  [56-89] 61 (05/13 0643) Resp:  [8-18] 18 (05/13 0643) BP: (108-149)/(64-94) 119/68 mmHg (05/13 0643) SpO2:  [97 %-100 %] 100 % (05/13 0643) Weight:  [190 lb (86.183 kg)] 190 lb (86.183 kg) (05/12 0844)  Intake/Output from previous day: 05/12 0701 - 05/13 0700 In: 2900 [P.O.:1200; I.V.:1700] Out: 3700 [Urine:3550] Intake/Output this shift:    Recent Labs Lab 11/30/14 0830 12/01/14 0535  WBC 5.1 9.4  HGB 13.0 10.9*  HCT 39.9 33.2*  PLT 174 165    No results for input(s): NA, K, CL, CO2, BUN, CREATININE, CALCIUM, PROT, BILITOT, ALKPHOS, ALT, AST, GLUCOSE in the last 168 hours.  Invalid input(s): LABALBU  EXAM: General: alert, cooperative and no distress Resp: clear to auscultation bilaterally Cardio: regular rate and rhythm, S1, S2 normal, no murmur, click, rub or gallop GI: Bowel sounds present and active; soft and non-tender. Extremities: SCD hose in place and functioning;  no calf tenderness. Vaginal Bleeding: minimal and faint.   Assessment: s/p Procedure(s): ANTERIOR (CYSTOCELE) AND POSTERIOR REPAIR (RECTOCELE) CYSTOSCOPY: progressing well and anemia  Plan: Advance diet Encourage ambulation Consider discharge home later today.    Tierany Appleby, PA-C 12/01/2014 7:45 AM

## 2014-12-01 NOTE — Anesthesia Postprocedure Evaluation (Signed)
  Anesthesia Post-op Note  Patient: Olivia Simon  Procedure(s) Performed: Procedure(s): ANTERIOR (CYSTOCELE) AND POSTERIOR REPAIR (RECTOCELE) (N/A) CYSTOSCOPY  Patient Location: [facility]  Anesthesia Type:General  Level of Consciousness: awake  Airway and Oxygen Therapy: Patient Spontanous Breathing  Post-op Pain: mild  Post-op Assessment: Patient's Cardiovascular Status Stable and Respiratory Function Stable  Post-op Vital Signs: stable  Last Vitals:  Filed Vitals:   12/01/14 0643  BP: 119/68  Pulse: 61  Temp: 37.1 C  Resp: 18    Complications: No apparent anesthesia complications

## 2014-12-01 NOTE — Progress Notes (Signed)
UR chart review completed.  

## 2015-02-19 ENCOUNTER — Encounter: Payer: Self-pay | Admitting: Gastroenterology

## 2015-03-21 ENCOUNTER — Ambulatory Visit: Payer: 59 | Admitting: Gastroenterology

## 2015-04-23 ENCOUNTER — Telehealth: Payer: Self-pay | Admitting: Family Medicine

## 2015-04-23 NOTE — Telephone Encounter (Signed)
Pt calling to see if we can refer her for a sleep study. 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 Beverely Low is aware that she has trouble sleeping. She said she wants to stop taking medications and get to the root of the problem. Advised pt she would probably need appt since last seen in January 2016 and she requested msg to nurse.

## 2015-04-24 NOTE — Telephone Encounter (Signed)
Can you please try to call and schedule pt?

## 2015-04-24 NOTE — Telephone Encounter (Signed)
Scheduled for 04/25/15

## 2015-04-24 NOTE — Telephone Encounter (Signed)
Pt needs an appt to discuss her sxs prior to referral

## 2015-04-25 ENCOUNTER — Ambulatory Visit: Payer: Self-pay | Admitting: Family Medicine

## 2015-04-25 ENCOUNTER — Telehealth: Payer: Self-pay | Admitting: Family Medicine

## 2015-04-25 DIAGNOSIS — Z0289 Encounter for other administrative examinations: Secondary | ICD-10-CM

## 2015-05-07 NOTE — Telephone Encounter (Signed)
Pt was no show 04/25/15 2:00pm for acute appt to discuss sleep issues, pt did not reschedule, charge or no charge?

## 2015-05-07 NOTE — Telephone Encounter (Signed)
Yes- pt needs no show fee 

## 2015-05-10 ENCOUNTER — Encounter: Payer: Self-pay | Admitting: Gastroenterology

## 2015-05-10 ENCOUNTER — Ambulatory Visit (INDEPENDENT_AMBULATORY_CARE_PROVIDER_SITE_OTHER): Payer: Medicaid Other | Admitting: Gastroenterology

## 2015-05-10 VITALS — BP 104/68 | HR 72

## 2015-05-10 DIAGNOSIS — K5900 Constipation, unspecified: Secondary | ICD-10-CM

## 2015-05-10 MED ORDER — HYDROCORTISONE 2.5 % RE CREA: 1.0000 "application " | TOPICAL_CREAM | Freq: Two times a day (BID) | RECTAL | Status: DC

## 2015-05-10 MED ORDER — LINACLOTIDE 145 MCG PO CAPS: 145.0000 ug | ORAL_CAPSULE | Freq: Every day | ORAL | Status: DC

## 2015-05-10 NOTE — Patient Instructions (Signed)
We have sent the following medications to your pharmacy for you to pick up at your convenience:Linzess.  Please follow up with Dr. Lavon PaganiniNandigam in 3 months.
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
## 2015-05-28 ENCOUNTER — Telehealth: Payer: Self-pay | Admitting: Gastroenterology

## 2015-05-28 NOTE — Telephone Encounter (Signed)
1) Miralax purge later today or tomorrow 2) Can purchase Recticare at drugstore - apply to rectum prn - may be able to find generic/store brand 5% topical lidocaine cream cheaper - look near the recticare 3) Restart Linzess after the purge - I would restart at 145 ug dose and if that does not work after this then call back

## 2015-05-28 NOTE — Telephone Encounter (Signed)
I have left message for the patient to call back 

## 2015-05-28 NOTE — Telephone Encounter (Signed)
Patient is instructed. She states she will call me back tomorrow.

## 2015-05-28 NOTE — Telephone Encounter (Signed)
Doc of the Day 44 year old female with history of chronic constipation was started on Linaclotide 145 mcg daily. It worked for 2 days, then stopped working. She doubled her dosage without a bowel movement. Now she is not taking anything, because she didn't see the point if it does not work. She is passing gas and tiny amount of stool. She has been using Prep H cream on the hemorrhoids, but this bout of constipation has made them very sore. No bleeding. Drinking fluids. Compliant with fiber in the diet.

## 2016-10-15 LAB — HM MAMMOGRAPHY: HM Mammogram: NORMAL (ref 0–4)

## 2016-10-15 LAB — HM PAP SMEAR

## 2017-06-16 ENCOUNTER — Telehealth: Payer: Self-pay | Admitting: Family Medicine

## 2017-06-16 NOTE — Telephone Encounter (Signed)
Patient is asking to schedule an appointment with you.   She has not been seen since 06/2014, there is a phone note and a no show from 2016.  Okay for patient to schedule appointment?

## 2017-06-16 NOTE — Telephone Encounter (Signed)
LM for patient to call to discuss.  

## 2017-06-16 NOTE — Telephone Encounter (Signed)
Ok to schedule appt but if she is self pay she will need to abide by our self pay rules and pay the required amount up front.

## 2017-06-16 NOTE — Telephone Encounter (Signed)
Copied from CRM [phone]#[id number]. Topic: Quick Communication - See Telephone Encounter >> Jun 16, 2017 10:08 AM Rudi CocoLathan, Parthena Fergeson M, NT wrote: CRM for notification. See Telephone encounter for:   06/16/17. Pt. Called and wants to become a new pt. With Dr. Beverely Lowabori pt. Has been with Dr. Beverely Lowabori before but has been a while. Pt. Would also like to know if she has any appts. This week if she can see her. Pt. Is self pay. Give pt. A call at [phone] leave a vm if pt. Does not answer

## 2017-06-16 NOTE — Telephone Encounter (Signed)
Offered patient an appointment December 12th, 8am.  Giving 30 minutes because it has been a while since she was seen and she was wanting to come in for surgical clearance.   Patient states that this is too late for her (having a tummy tuck on the 19th), so she declined an appointment.     If patient calls back and wants an appointment, it needs to be a 30 minute appt, and she needs to be made aware of the self pay policy and she will have to pay at the time of the visit.

## 2017-10-15 ENCOUNTER — Other Ambulatory Visit: Payer: Self-pay

## 2017-10-15 ENCOUNTER — Encounter: Payer: Self-pay | Admitting: Family Medicine

## 2017-10-15 ENCOUNTER — Ambulatory Visit (INDEPENDENT_AMBULATORY_CARE_PROVIDER_SITE_OTHER): Payer: PRIVATE HEALTH INSURANCE | Admitting: Family Medicine

## 2017-10-15 VITALS — BP 124/86 | HR 80 | Temp 98.0°F | Resp 16 | Ht 66.0 in | Wt 206.5 lb

## 2017-10-15 DIAGNOSIS — G4733 Obstructive sleep apnea (adult) (pediatric): Secondary | ICD-10-CM | POA: Diagnosis not present

## 2017-10-15 DIAGNOSIS — Z1231 Encounter for screening mammogram for malignant neoplasm of breast: Secondary | ICD-10-CM

## 2017-10-15 DIAGNOSIS — G4730 Sleep apnea, unspecified: Secondary | ICD-10-CM | POA: Insufficient documentation

## 2017-10-15 DIAGNOSIS — E6609 Other obesity due to excess calories: Secondary | ICD-10-CM

## 2017-10-15 DIAGNOSIS — Z1239 Encounter for other screening for malignant neoplasm of breast: Secondary | ICD-10-CM

## 2017-10-15 DIAGNOSIS — Z6833 Body mass index (BMI) 33.0-33.9, adult: Secondary | ICD-10-CM

## 2017-10-15 NOTE — Assessment & Plan Note (Signed)
New to provider, ongoing for pt.  I reviewed pt's chart from [facility] and it appears that there were orders faxed to [facility] for pt's CPAP.  She said she was not able to tolerate CPAP during in house sleep study.  She reports that she takes Trazodone for her OSA- I explained that this does not treat her breathing or OSA.  Referral placed to go back to Sleep Medicine at [facility].  Pt expressed understanding and is in agreement w/ plan.

## 2017-10-15 NOTE — Patient Instructions (Signed)
Schedule your complete physical in 6 months We'll notify you of your lab results and make any changes if needed Continue to work on healthy diet and regular exercise- you can do it! We'll call you with your mammogram appt We'll call you with your Sleep appt at [facility] to determine if you need a CPAP Call with any questions or concerns Welcome Back!!!

## 2017-10-15 NOTE — Progress Notes (Signed)
   Subjective:    Patient ID: Olivia Simon, female    DOB: 10/18/1970, 47 y.o.   MRN: [id number]  HPI Pt here today to re-establish.  Has been seeing [facility].  Had mammogram 10/04/16- due for repeat mammo ([facility]).  UTD on pap- last year (09/03/15).  Last Tdap 2012.  Sleep apnea- pt had sleep study and indicated that was not able to use the CPAP machine.  Pt saw Dr Day at [facility] in 2016.  Overweight- BMI is 33.3.  Denies CP, SOB, HAs, visual changes, edema.  No abd pain, N/V.  Was previously seeing Dr Cathey EndowBowen.   Review of Systems For ROS see HPI     Objective:   Physical Exam  Constitutional: She is oriented to person, place, and time. She appears well-developed and well-nourished. No distress.  obese  HENT:  Head: Normocephalic and atraumatic.  Eyes: Pupils are equal, round, and reactive to light. Conjunctivae and EOM are normal.  Neck: Normal range of motion. Neck supple. No thyromegaly present.  Cardiovascular: Normal rate, regular rhythm, normal heart sounds and intact distal pulses.  No murmur heard. Pulmonary/Chest: Effort normal and breath sounds normal. No respiratory distress.  Abdominal: Soft. She exhibits no distension. There is no tenderness.  Musculoskeletal: She exhibits no edema.  Lymphadenopathy:    She has no cervical adenopathy.  Neurological: She is alert and oriented to person, place, and time.  Skin: Skin is warm and dry.  Psychiatric: She has a normal mood and affect. Her behavior is normal.  Vitals reviewed.         Assessment & Plan:

## 2017-10-15 NOTE — Assessment & Plan Note (Signed)
Stressed need for healthy diet and regular exercise.  Check labs to risk stratify.  Will follow. 

## 2017-10-15 NOTE — Addendum Note (Signed)
Addended byDene Gentry: PETERMAN, AMY M on: 10/15/2017 04:02 PM   Modules accepted: Orders

## 2017-10-16 ENCOUNTER — Other Ambulatory Visit (INDEPENDENT_AMBULATORY_CARE_PROVIDER_SITE_OTHER): Payer: PRIVATE HEALTH INSURANCE

## 2017-10-16 ENCOUNTER — Telehealth: Payer: Self-pay | Admitting: Family Medicine

## 2017-10-16 DIAGNOSIS — Z6833 Body mass index (BMI) 33.0-33.9, adult: Secondary | ICD-10-CM

## 2017-10-16 DIAGNOSIS — E6609 Other obesity due to excess calories: Secondary | ICD-10-CM

## 2017-10-16 LAB — LIPID PANEL
Cholesterol: 144 mg/dL (ref 0–200)
HDL: 42.6 mg/dL (ref >39.00)
LDL Cholesterol: 85 mg/dL (ref 0–99)
NONHDL: 101.06
Total CHOL/HDL Ratio: 3
Triglycerides: 78 mg/dL (ref 0.0–149.0)
VLDL: 15.6 mg/dL (ref 0.0–40.0)

## 2017-10-16 LAB — HEPATIC FUNCTION PANEL
ALT: 10 U/L (ref 0–35)
AST: 14 U/L (ref 0–37)
Albumin: 3.5 g/dL (ref 3.5–5.2)
Alkaline Phosphatase: 85 U/L (ref 39–117)
BILIRUBIN DIRECT: 0.1 mg/dL (ref 0.0–0.3)
BILIRUBIN TOTAL: 0.3 mg/dL (ref 0.2–1.2)
Total Protein: 7.2 g/dL (ref 6.0–8.3)

## 2017-10-16 LAB — CBC WITH DIFFERENTIAL/PLATELET
Basophils Absolute: 0 10*3/uL (ref 0.0–0.1)
Basophils Relative: 0.5 % (ref 0.0–3.0)
EOS PCT: 2.4 % (ref 0.0–5.0)
Eosinophils Absolute: 0.1 10*3/uL (ref 0.0–0.7)
HCT: 37.2 % (ref 36.0–46.0)
HEMOGLOBIN: 12.2 g/dL (ref 12.0–15.0)
Lymphocytes Relative: 38.5 % (ref 12.0–46.0)
Lymphs Abs: 1.8 10*3/uL (ref 0.7–4.0)
MCHC: 32.9 g/dL (ref 30.0–36.0)
MCV: 86.8 fl (ref 78.0–100.0)
MONOS PCT: 10.8 % (ref 3.0–12.0)
Monocytes Absolute: 0.5 10*3/uL (ref 0.1–1.0)
NEUTROS ABS: 2.3 10*3/uL (ref 1.4–7.7)
Neutrophils Relative %: 47.8 % (ref 43.0–77.0)
Platelets: 217 10*3/uL (ref 150.0–400.0)
RBC: 4.29 Mil/uL (ref 3.87–5.11)
RDW: 13.9 % (ref 11.5–15.5)
WBC: 4.8 10*3/uL (ref 4.0–10.5)

## 2017-10-16 LAB — BASIC METABOLIC PANEL
BUN: 14 mg/dL (ref 6–23)
CO2: 27 mEq/L (ref 19–32)
CREATININE: 0.66 mg/dL (ref 0.40–1.20)
Calcium: 8.5 mg/dL (ref 8.4–10.5)
Chloride: 101 mEq/L (ref 96–112)
GFR: 122.69 mL/min (ref >60.00)
Glucose, Bld: 128 mg/dL — ABNORMAL HIGH (ref 70–99)
Potassium: 3.5 mEq/L (ref 3.5–5.1)
Sodium: 135 mEq/L (ref 135–145)

## 2017-10-16 LAB — TSH: TSH: 1.02 u[IU]/mL (ref 0.35–4.50)

## 2017-10-16 NOTE — Telephone Encounter (Signed)
Sending order to [facility].

## 2017-10-16 NOTE — Telephone Encounter (Signed)
FYI in case you need it for the mammogram orders

## 2017-10-16 NOTE — Telephone Encounter (Signed)
Copied from CRM [id number]. Topic: Quick Communication - See Telephone Encounter >> Oct 16, 2017  2:28 PM Arlyss Gandyichardson, Draydon Clairmont N, NT wrote: CRM for notification. See Telephone encounter for: 10/16/17. Pt calling back and states that Dr. Beverely Lowabori wanted to see where she wanted her mammogram done at and pt would like it done at [facility] since they have all her records.

## 2017-10-16 NOTE — Telephone Encounter (Signed)
Message signed in error.

## 2017-10-17 LAB — HIV ANTIBODY (ROUTINE TESTING W REFLEX): HIV: NONREACTIVE

## 2017-10-28 ENCOUNTER — Other Ambulatory Visit: Payer: Self-pay | Admitting: Family Medicine

## 2017-10-28 DIAGNOSIS — G4733 Obstructive sleep apnea (adult) (pediatric): Secondary | ICD-10-CM

## 2017-11-05 LAB — HM MAMMOGRAPHY

## 2017-11-09 ENCOUNTER — Telehealth: Payer: Self-pay | Admitting: Physician Assistant

## 2017-11-09 NOTE — Telephone Encounter (Signed)
Called patient and left a detailed voice message to let her the know the mammogram results per Leightonody.

## 2017-11-09 NOTE — Telephone Encounter (Signed)
PCP out of office this week. Reviewing messages in her absence. Received report of recent mammogram from [facility]. Mammogram is negative for malignancy. Great news! Notes some fibrous tissue. Repeat yearly.   I have abstracted results into chart and left report for Dr. Beverely Lowabori to review and sign off on upon return.

## 2017-12-18 ENCOUNTER — Encounter: Payer: Self-pay | Admitting: Family Medicine

## 2017-12-18 ENCOUNTER — Other Ambulatory Visit: Payer: Self-pay

## 2017-12-18 ENCOUNTER — Ambulatory Visit (INDEPENDENT_AMBULATORY_CARE_PROVIDER_SITE_OTHER): Payer: PRIVATE HEALTH INSURANCE | Admitting: Family Medicine

## 2017-12-18 VITALS — BP 120/78 | HR 81 | Temp 98.4°F | Resp 15 | Ht 66.0 in | Wt 207.4 lb

## 2017-12-18 DIAGNOSIS — Z6833 Body mass index (BMI) 33.0-33.9, adult: Secondary | ICD-10-CM

## 2017-12-18 DIAGNOSIS — Z202 Contact with and (suspected) exposure to infections with a predominantly sexual mode of transmission: Secondary | ICD-10-CM

## 2017-12-18 DIAGNOSIS — E6609 Other obesity due to excess calories: Secondary | ICD-10-CM | POA: Diagnosis not present

## 2017-12-18 MED ORDER — PHENTERMINE-TOPIRAMATE ER 3.75-23 MG PO CP24: 1.0000 | ORAL_CAPSULE | Freq: Every day | ORAL | 0 refills | Status: DC

## 2017-12-18 MED ORDER — PHENTERMINE-TOPIRAMATE ER 7.5-46 MG PO CP24: 1.0000 | ORAL_CAPSULE | Freq: Every day | ORAL | 2 refills | Status: DC

## 2017-12-18 NOTE — Progress Notes (Signed)
   Subjective:    Patient ID: Olivia Simon, female    DOB: 1970/11/16, 46 y.o.   MRN: [id number]  HPI Weight loss options- pt has been doing research into weight loss options.  She has looked into [facility], [place], [place].  Pt has used Phentermine in the past and suffered from dry mouth.  STD testing- pt had unprotected sex w/ ex-boyfriend.  Interested in testing.   Review of Systems For ROS see HPI     Objective:   Physical Exam  Constitutional: She is oriented to person, place, and time. She appears well-developed and well-nourished. No distress.  obese  HENT:  Head: Normocephalic and atraumatic.  Cardiovascular: Normal rate and regular rhythm.  Neurological: She is alert and oriented to person, place, and time.  Skin: Skin is warm and dry.  Psychiatric: She has a normal mood and affect. Her behavior is normal. Thought content normal.  Vitals reviewed.         Assessment & Plan:  Possible exposure to STD- new.  Stressed need for pt to protect herself at each encounter.  Will check labs and treat if needed.  Pt expressed understanding and is in agreement w/ plan.

## 2017-12-18 NOTE — Patient Instructions (Addendum)
Follow up in 6-8 weeks to recheck weight loss progress We'll notify you of your lab results and make any changes if needed Continue to work on healthy diet and regular exercise- you can do it! Start the Qsymia as directed.  Start w/ the 3.75mg  dose x14 days and then switch to the 7.5mg  dose daily Call with any questions or concerns Have a great weekend!

## 2017-12-18 NOTE — Assessment & Plan Note (Signed)
Ongoing issue for pt.  She is interested in medication at this time to assist her as she is not a candidate or able to afford bariatric surgery.  Stressed need for healthy diet and regular exercise.  After discussing all medications with her, will start Qsymia.  Will follow.

## 2017-12-21 ENCOUNTER — Telehealth: Payer: Self-pay | Admitting: Family Medicine

## 2017-12-21 LAB — HIV ANTIBODY (ROUTINE TESTING W REFLEX): HIV 1&2 Ab, 4th Generation: NONREACTIVE

## 2017-12-21 LAB — RPR: RPR Ser Ql: NONREACTIVE

## 2017-12-21 NOTE — Telephone Encounter (Signed)
Copied from CRM [id number]. Topic: Quick Communication - Rx Refill/Question >> Dec 21, 2017 12:43 PM Jonette EvaBarksdale, Harvey B wrote: Pt called and states she spoke w/ someone earlier but no phone note documented; apparently insurance denied medication b/c coding on a visit on the 31st was done wrong and the medication is possibly needing a PA  Medication: Phentermine-Topiramate 7.5-46 MG CP24 [[id number]][[id number]] , Phentermine-Topiramate 3.75-23 MG CP24 [[id number]][[id number]]   Has the patient contacted their pharmacy? Yes.   (Agent: If no, request that the patient contact the pharmacy for the refill.) (Agent: If yes, when and what did the pharmacy advise?)  [facility]  Agent: Please be advised that RX refills may take up to 3 business days. We ask that you follow-up with your pharmacy.

## 2017-12-23 ENCOUNTER — Telehealth: Payer: Self-pay | Admitting: Family Medicine

## 2017-12-23 NOTE — Telephone Encounter (Signed)
Can we please initiate a prior authorize

## 2017-12-23 NOTE — Telephone Encounter (Signed)
Copied from CRM [id number]. Topic: Quick Communication - Rx Refill/Question >> Dec 23, 2017 11:21 AM Mcneil, Ja-Kwan wrote: Medication: Phentermine-Topiramate 7.5-46 MG CP24 , Phentermine-Topiramate 3.75-23 MG CP24     Preferred Pharmacy (with phone number or street name): [facility]  [address], [place], [place] [address]  Phone: [phone]  Agent: Please be advised that RX refills may take up to 3 business days. We ask that you follow-up with your pharmacy.

## 2017-12-23 NOTE — Telephone Encounter (Signed)
Called pt insurance and spoke with Karin GoldenLorraine, she advised that this was not being processed correctly by the pharmacy. She [facility] [place] [facility] and said she was going to call and advise them how to process this medication.

## 2017-12-23 NOTE — Telephone Encounter (Signed)
Please advise 

## 2017-12-24 ENCOUNTER — Other Ambulatory Visit: Payer: Self-pay | Admitting: Emergency Medicine

## 2017-12-24 MED ORDER — PHENTERMINE-TOPIRAMATE ER 3.75-23 MG PO CP24: 1.0000 | ORAL_CAPSULE | Freq: Every day | ORAL | 0 refills | Status: DC

## 2017-12-24 MED ORDER — PHENTERMINE-TOPIRAMATE ER 7.5-46 MG PO CP24: 1.0000 | ORAL_CAPSULE | Freq: Every day | ORAL | 2 refills | Status: DC

## 2017-12-24 NOTE — Addendum Note (Signed)
Addended by: Geannie RisenBRODMERKEL, Mikahla Wisor L on: 12/24/2017 03:42 PM   Modules accepted: Orders

## 2017-12-24 NOTE — Telephone Encounter (Signed)
Phentermine-Topiramate 7.5-46 refill request  LOV 12/18/17 with Dr. Beverely Lowabori  Last refill:  12/18/17  #30  2 refills  [facility]  [address], [place], [place] [address] [phone] (Unable to locate this pharmacy to change it in the chart).

## 2017-12-24 NOTE — Telephone Encounter (Signed)
Copied from CRM [id number]. Topic: Quick Communication - Rx Refill/Question >> Dec 23, 2017 11:21 AM Mcneil, Ja-Kwan wrote: Medication: Phentermine-Topiramate 7.5-46 MG CP24 , Phentermine-Topiramate 3.75-23 MG CP24     Preferred Pharmacy (with phone number or street name): [facility]  [address], [place], [place] [address]  Phone: [phone]  Agent: Please be advised that RX refills may take up to 3 business days. We ask that you follow-up with your pharmacy. >> Dec 24, 2017  1:08 PM Stovall, Shana A wrote: Pt called in and stated that [facility]

## 2017-12-24 NOTE — Telephone Encounter (Signed)
Encounter closed. Refill request sent to PCP with meds pended.

## 2017-12-24 NOTE — Telephone Encounter (Signed)
Ok to send to new pharmacy (or pend for me to send)

## 2017-12-25 ENCOUNTER — Institutional Professional Consult (permissible substitution): Payer: PRIVATE HEALTH INSURANCE | Admitting: Pulmonary Disease

## 2017-12-28 NOTE — Telephone Encounter (Signed)
CRM created incase patient calls back. 

## 2017-12-28 NOTE — Telephone Encounter (Signed)
Called patient and LMOVM: Per Dr. Beverely Lowabori: We have to take the medication as prescribed and adjust it as recommended. The slow start is to avoid shocking the metabolism and causing rebound weight gain.  Ok for [facility] to Discuss results / PCP / recommendations / Schedule patient  Not sure if the patient will call back

## 2017-12-28 NOTE — Telephone Encounter (Signed)
Called patient and gave her the message to let her know that the dosage will change in 14 days. Patient is concerned because this dosage has not suppressed her appetite since she started taking on Saturday. Patient is concerned since last time she was on a higher dosage and she noticed a difference. She stated that this medication is expensive and she does not want to pay for something that may not work for her.   Patient is aware of the dosage change in 14 days and has been advised that this is to help her body adjust to this medication and not shock the metabolism to help with long term results.  Please advise.

## 2017-12-28 NOTE — Telephone Encounter (Signed)
We have to take the medication as prescribed and adjust it as recommended.  The slow start is to avoid shocking the metabolism and causing rebound weight gain

## 2017-12-28 NOTE — Telephone Encounter (Signed)
Noted.  And that is why she will increase to the higher dose after 14 days

## 2017-12-28 NOTE — Telephone Encounter (Signed)
Patient says she has take Phentermine-Topiramate before so the Phentermine-Topiramate 3.75-23 MG CP24  dosage is not having any effect on her. She believes she needs a higher dose to start. Please advise.

## 2017-12-28 NOTE — Telephone Encounter (Signed)
Pt has only been on this medication for 3 days.  The appropriate dosing schedule is the one we gave her.  It must build up in her system which is why we increase the dose in steps- not start at the higher dose.  No medication is magic and she will still need to control what she is putting in her mouth.  I'm sorry she is frustrated but this takes time

## 2017-12-28 NOTE — Telephone Encounter (Signed)
Pt aware of below message and states the medication is not suppressing her appetite as it did when she previously took the medication at a higher dose.

## 2017-12-28 NOTE — Telephone Encounter (Signed)
Called patient and let her know that she needs to take the appropriate dosages so that the medication can build up in her system and work for her so that she can get the long term results that she is looking for. Also advised to work on a healthy diet and to stay hydrated. Patient verbalized an understanding.

## 2018-01-07 NOTE — Telephone Encounter (Signed)
Patient said her Phentermine-Topiramate 3.75-23 MG CP24 is not suppressing her appetite, and it is giving her severe constipation. She went 6 days before a bowel movement and that was after asking the pharmacist to recommend something.  She does not want to take this anymore.  She stated she would like an amphetamine instead.  Please advise.  Please call [phone]

## 2018-01-07 NOTE — Telephone Encounter (Signed)
Pt called back and advised her of PCP recommendations. Appt made for Monday to discuss appetite suppressant.

## 2018-01-07 NOTE — Telephone Encounter (Signed)
If we prescribe phentermine (which is what she has taken before), it is only for 12 weeks.  This is not a long term medication

## 2018-01-07 NOTE — Telephone Encounter (Signed)
Called pt to advise of PCP recommendations. All phone numbers just kept ringing.. No voicemail prompt to leave a message.   Ok for [facility] to Discuss results / PCP recommendations / Schedule patient.

## 2018-01-07 NOTE — Telephone Encounter (Signed)
FYI

## 2018-01-11 ENCOUNTER — Ambulatory Visit: Payer: PRIVATE HEALTH INSURANCE | Admitting: Family Medicine

## 2018-01-15 ENCOUNTER — Ambulatory Visit (INDEPENDENT_AMBULATORY_CARE_PROVIDER_SITE_OTHER): Payer: PRIVATE HEALTH INSURANCE | Admitting: Family Medicine

## 2018-01-15 ENCOUNTER — Other Ambulatory Visit: Payer: Self-pay

## 2018-01-15 ENCOUNTER — Encounter: Payer: Self-pay | Admitting: Family Medicine

## 2018-01-15 ENCOUNTER — Ambulatory Visit: Payer: PRIVATE HEALTH INSURANCE | Admitting: Family Medicine

## 2018-01-15 VITALS — BP 126/88 | HR 70 | Temp 99.5°F | Resp 16 | Ht 66.0 in | Wt 207.2 lb

## 2018-01-15 DIAGNOSIS — G4700 Insomnia, unspecified: Secondary | ICD-10-CM

## 2018-01-15 DIAGNOSIS — Z6833 Body mass index (BMI) 33.0-33.9, adult: Secondary | ICD-10-CM

## 2018-01-15 DIAGNOSIS — E6609 Other obesity due to excess calories: Secondary | ICD-10-CM

## 2018-01-15 MED ORDER — PHENTERMINE HCL 37.5 MG PO CAPS: 37.5000 mg | ORAL_CAPSULE | ORAL | 2 refills | Status: DC

## 2018-01-15 MED ORDER — TRAZODONE HCL 50 MG PO TABS: 50.0000 mg | ORAL_TABLET | Freq: Every day | ORAL | 3 refills | Status: DC

## 2018-01-15 NOTE — Assessment & Plan Note (Signed)
Refill on Trazodone provided.

## 2018-01-15 NOTE — Assessment & Plan Note (Signed)
Ongoing issue for pt.  She is frustrated by lack of results on Qsymia.  Asking to switch to Phentermine 37.5 mg daily.  Discussed that she may have rebound weight gain if she doesn't work on diet and exercise at the same time.  Pt expressed understanding and is in agreement w/ plan.

## 2018-01-15 NOTE — Progress Notes (Signed)
   Subjective:    Patient ID: Olivia Simon, female    DOB: 12/12/1970, 47 y.o.   MRN: [id number]  HPI Obesity- ongoing issue.  Pt reports the Qsymia was not effective in suppressing her appetite.  Also caused constipation.  Pt is interested in restarting Phentermine.  Pt is walking regularly.  Pt is attempting to avoid fried foods, does not eat red meat.  Eating 'some vegetables' and very little fruit.   Insomnia- needs refill on Trazodone.   Review of Systems For ROS see HPI     Objective:   Physical Exam  Constitutional: She is oriented to person, place, and time. She appears well-developed and well-nourished. No distress.  HENT:  Head: Normocephalic and atraumatic.  Eyes: Pupils are equal, round, and reactive to light. Conjunctivae and EOM are normal.  Neck: Normal range of motion. Neck supple. No thyromegaly present.  Cardiovascular: Normal rate, regular rhythm, normal heart sounds and intact distal pulses.  No murmur heard. Pulmonary/Chest: Effort normal and breath sounds normal. No respiratory distress.  Abdominal: Soft. She exhibits no distension. There is no tenderness.  Musculoskeletal: She exhibits no edema.  Lymphadenopathy:    She has no cervical adenopathy.  Neurological: She is alert and oriented to person, place, and time.  Skin: Skin is warm and dry.  Psychiatric: She has a normal mood and affect. Her behavior is normal.  Vitals reviewed.         Assessment & Plan:

## 2018-01-15 NOTE — Patient Instructions (Signed)
Follow up in 6-8 weeks to recheck BP and weight loss progress START the Phentermine once daily in the AM Continue to work on healthy diet and regular exercise- you can do it! Call with any questions or concerns Have a great weekend!!!

## 2018-01-29 ENCOUNTER — Telehealth: Payer: Self-pay | Admitting: Family Medicine

## 2018-01-29 NOTE — Telephone Encounter (Signed)
Copied from CRM [id number]. Topic: Quick Communication - See Telephone Encounter >> Jan 29, 2018  4:18 PM Brodmerkel, Joyce CopaJessica L, CMA wrote: CRM for notification. See Telephone encounter for: 01/29/18.  Per PCP: Phentermine is a stimulant and can cause insomnia.  If this is the case, she should stop the Phentermine If constipated, should add OTC Miralax daily (if not twice daily) until having regular bowel movements  Ok for [facility] to Discuss results / PCP recommendations / Schedule patient.

## 2018-01-29 NOTE — Telephone Encounter (Signed)
Pt started phentermine on Monday and one pill of trazodone and did not sleep.  Tuesday she took the phentermine and 1.5 tablets of trazodone and did not sleep Wednesday she took the phentermine and 2 tablets of trazodone and finally slept Could she not take the phentermine on the weekend, and remain on two trazodone during the week.    Unrelated concern she has been constipated for 8 days no BM, pt is not having any abdominal pain, bloating, etc. Pt has tried metamucil twice a day to no avail. Please advise on recommendation, could this be a result of her medications.

## 2018-01-29 NOTE — Telephone Encounter (Signed)
Copied from CRM [id number]. Topic: Quick Communication - Rx Refill/Question >> Jan 29, 2018  8:14 AM Cipriano BunkerLambe, Annette S wrote: Medication: ??? phentermine 37.5 MG capsule She has not had a BM for 8 days  Having a hard time sleeping  (has to take 100mg ) traZODone. With 50 mg she did sleep.   She is concerned about taking all this medication and can she stop stop Phentermine on the weekend? She has lost 6 lbs.  Please call pt.

## 2018-01-29 NOTE — Telephone Encounter (Signed)
Called and left a detailed message for pt per DPR to advise of PCP recommendations  Ok for [facility] to Discuss results / PCP recommendations / Schedule patient.

## 2018-01-29 NOTE — Telephone Encounter (Signed)
Left message for pt to return call to office.

## 2018-01-29 NOTE — Telephone Encounter (Signed)
Phentermine is a stimulant and can cause insomnia.  If this is the case, she should stop the Phentermine If constipated, should add OTC Miralax daily (if not twice daily) until having regular bowel movements

## 2018-02-01 ENCOUNTER — Institutional Professional Consult (permissible substitution): Payer: PRIVATE HEALTH INSURANCE | Admitting: Pulmonary Disease

## 2018-02-04 ENCOUNTER — Ambulatory Visit: Payer: PRIVATE HEALTH INSURANCE | Admitting: Family Medicine

## 2018-03-12 ENCOUNTER — Ambulatory Visit: Payer: PRIVATE HEALTH INSURANCE | Admitting: Family Medicine

## 2018-03-19 ENCOUNTER — Ambulatory Visit: Payer: PRIVATE HEALTH INSURANCE | Admitting: Family Medicine

## 2018-03-24 ENCOUNTER — Ambulatory Visit: Payer: PRIVATE HEALTH INSURANCE | Admitting: Family Medicine

## 2018-04-01 ENCOUNTER — Encounter: Payer: Self-pay | Admitting: Family Medicine

## 2018-04-01 ENCOUNTER — Other Ambulatory Visit: Payer: Self-pay

## 2018-04-01 ENCOUNTER — Ambulatory Visit: Payer: PRIVATE HEALTH INSURANCE | Admitting: Family Medicine

## 2018-04-01 VITALS — BP 121/80 | HR 78 | Temp 99.2°F | Resp 16 | Ht 66.0 in | Wt 207.1 lb

## 2018-04-01 DIAGNOSIS — N941 Unspecified dyspareunia: Secondary | ICD-10-CM

## 2018-04-01 DIAGNOSIS — M222X1 Patellofemoral disorders, right knee: Secondary | ICD-10-CM | POA: Diagnosis not present

## 2018-04-01 NOTE — Patient Instructions (Signed)
Follow up as needed or as scheduled We will call you with your GYN appt It sounds as if what you experienced was hitting your cervix.  Try changing positions The knee pain is from patellofemoral syndrome Ice as needed Do the exercises as directed Call with any questions or concerns Hang in there!

## 2018-04-01 NOTE — Progress Notes (Signed)
   Subjective:    Patient ID: Olivia HaroldPatrice L Simon, female    DOB: 02/09/1971, 47 y.o.   MRN: [id number]  HPI Pain w/ intercourse- pt reports 'awkward' sexual encounter in 1 particular position as bf was 'hitting something'.  Did not have pain once she changed positions.  R knee pain- pt has seen someone previously about this and was told 'there's nothing wrong with it'.  Had to ice her leg after using the elliptical.  At times will limp.  Pain is anterolateral and will intermittently swell.  No knee pain currently.   Review of Systems For ROS see HPI     Objective:   Physical Exam  Constitutional: She is oriented to person, place, and time. She appears well-developed and well-nourished. No distress.  obese  Genitourinary:  Genitourinary Comments: Deferred at pt's request  Musculoskeletal: Normal range of motion. She exhibits no edema, tenderness (no TTP over medial or lateral joint line of R knee, no TTP over patella tendon) or deformity.  Neurological: She is alert and oriented to person, place, and time. She displays normal reflexes. She exhibits normal muscle tone. Coordination normal.  Skin: Skin is warm and dry.  Vitals reviewed.         Assessment & Plan:  Dyspareunia- new.  Pt has hx of pelvic prolapse and discussed that most likely what she experienced was either hitting the cervix or the body of the uterus.  She is overdue for pap so will refer back to GYN for complete evaluation.  Pt expressed understanding and is in agreement w/ plan.   R knee pain/patellofemoral syndrome- pt's hx of pain after prolonged sitting and the location of her pain (anterolateral) is consistent w/ patellofemoral syndrome.  Handout on dx and exercises provided.  Reviewed supportive care and red flags that should prompt return.  Pt expressed understanding and is in agreement w/ plan.

## 2018-04-09 ENCOUNTER — Encounter: Payer: Self-pay | Admitting: Family Medicine

## 2018-04-09 MED ORDER — LINACLOTIDE 72 MCG PO CAPS: 72.0000 ug | ORAL_CAPSULE | Freq: Every day | ORAL | 3 refills | Status: DC

## 2018-05-05 LAB — HM PAP SMEAR

## 2018-05-12 ENCOUNTER — Encounter: Payer: Self-pay | Admitting: General Practice

## 2018-05-26 ENCOUNTER — Ambulatory Visit: Payer: 59 | Admitting: Family Medicine

## 2018-05-26 DIAGNOSIS — Z0289 Encounter for other administrative examinations: Secondary | ICD-10-CM

## 2018-05-27 ENCOUNTER — Other Ambulatory Visit: Payer: Self-pay | Admitting: Orthopedic Surgery

## 2018-05-27 DIAGNOSIS — M25561 Pain in right knee: Secondary | ICD-10-CM

## 2018-05-27 DIAGNOSIS — W3400XA Accidental discharge from unspecified firearms or gun, initial encounter: Secondary | ICD-10-CM

## 2018-05-28 ENCOUNTER — Encounter: Payer: Self-pay | Admitting: Family Medicine

## 2018-05-31 ENCOUNTER — Other Ambulatory Visit: Payer: 59

## 2018-06-02 ENCOUNTER — Ambulatory Visit
Admission: RE | Admit: 2018-06-02 | Discharge: 2018-06-02 | Disposition: A | Discharge status: Home / Self Care | Payer: 59 | Source: Ambulatory Visit | Attending: Orthopedic Surgery | Admitting: Orthopedic Surgery

## 2018-06-02 DIAGNOSIS — W3400XA Accidental discharge from unspecified firearms or gun, initial encounter: Secondary | ICD-10-CM

## 2018-06-04 ENCOUNTER — Other Ambulatory Visit: Payer: 59

## 2018-06-07 ENCOUNTER — Other Ambulatory Visit: Payer: Self-pay | Admitting: Obstetrics & Gynecology

## 2018-06-07 DIAGNOSIS — M25561 Pain in right knee: Secondary | ICD-10-CM

## 2018-06-14 ENCOUNTER — Ambulatory Visit
Admission: RE | Admit: 2018-06-14 | Discharge: 2018-06-14 | Disposition: A | Discharge status: Home / Self Care | Payer: 59 | Source: Ambulatory Visit | Attending: Obstetrics & Gynecology | Admitting: Obstetrics & Gynecology

## 2018-06-14 DIAGNOSIS — M25561 Pain in right knee: Secondary | ICD-10-CM

## 2018-06-14 MED ORDER — IOHEXOL 180 MG/ML  SOLN
35.0000 mL | Freq: Once | INTRAMUSCULAR | Status: AC | PRN
  Administered 2018-06-14: 35 mL via INTRA_ARTICULAR

## 2018-08-23 ENCOUNTER — Ambulatory Visit: Payer: 59 | Admitting: Family Medicine

## 2018-08-26 ENCOUNTER — Other Ambulatory Visit: Payer: Self-pay

## 2018-08-26 ENCOUNTER — Ambulatory Visit: Payer: 59 | Admitting: Family Medicine

## 2018-08-26 ENCOUNTER — Encounter: Payer: Self-pay | Admitting: Family Medicine

## 2018-08-26 VITALS — BP 123/80 | HR 81 | Temp 98.4°F | Resp 16 | Ht 66.0 in | Wt 211.0 lb

## 2018-08-26 DIAGNOSIS — M545 Low back pain, unspecified: Secondary | ICD-10-CM

## 2018-08-26 DIAGNOSIS — Z6834 Body mass index (BMI) 34.0-34.9, adult: Secondary | ICD-10-CM

## 2018-08-26 DIAGNOSIS — E6609 Other obesity due to excess calories: Secondary | ICD-10-CM

## 2018-08-26 MED ORDER — CYCLOBENZAPRINE HCL 10 MG PO TABS: 10.0000 mg | ORAL_TABLET | Freq: Three times a day (TID) | ORAL | 0 refills | Status: DC | PRN

## 2018-08-26 MED ORDER — MELOXICAM 15 MG PO TABS: 15.0000 mg | ORAL_TABLET | Freq: Every day | ORAL | 0 refills | Status: DC

## 2018-08-26 NOTE — Assessment & Plan Note (Signed)
Ongoing issue for pt.  BM has increased to 34.06.  Stressed need for healthy diet and regular exercise.  Will continue to follow.

## 2018-08-26 NOTE — Progress Notes (Signed)
   Subjective:    Patient ID: Olivia Simon, female    DOB: 1971/01/01, 48 y.o.   MRN: [id number]  HPI Back pain- low back pain.  Has been soaking in Epsom salts, icing, and seeing a Chiropractor but not getting any relief.  sxs have been consistent x2-3 months.  Pain is worse when sitting or lying.  No radiation of pain into butt or thigh.  Pt has been having knee problems which can alter gait but no known back injury.  Pain is central.  Some relief w/ extra strength tylenol.   Review of Systems For ROS see HPI     Objective:   Physical Exam Vitals signs reviewed.  Constitutional:      General: She is not in acute distress.    Appearance: Normal appearance. She is obese.  HENT:     Head: Normocephalic and atraumatic.  Cardiovascular:     Pulses: Normal pulses.  Musculoskeletal: Normal range of motion.        General: Tenderness (TTP over lumbar spine and bilateral paraspinal muscles) present.     Comments: No pain w/ back flexion/extension (-) SLR bilaterally  Skin:    General: Skin is warm and dry.     Findings: No rash.  Neurological:     General: No focal deficit present.     Mental Status: She is alert and oriented to person, place, and time.     Cranial Nerves: No cranial nerve deficit.     Motor: No weakness.     Coordination: Coordination normal.     Gait: Gait normal.  Psychiatric:        Mood and Affect: Mood normal.        Behavior: Behavior normal.        Thought Content: Thought content normal.           Assessment & Plan:

## 2018-08-26 NOTE — Patient Instructions (Signed)
Follow up by phone or MyChart in 10-14 days to let me know how your back pain is doing START the once daily Meloxicam to help w/ back inflammation- take w/ food USE the Cyclobenzaprine to help w/ muscle spasms- may cause drowsiness so best for nights/weekends HEAT! Continue to do gentle stretching Exercise as you are able and try and make healthy food choices Call with any questions or concerns Hang in there!

## 2018-08-26 NOTE — Assessment & Plan Note (Signed)
Deteriorated.  Pt has been seeing a chiropractor w/o relief.  Will start Flexeril for paraspinal spasm and scheduled NSAIDs.  If no improvement, will need referral to Sports Medicine.  Pt expressed understanding and is in agreement w/ plan.

## 2018-09-15 ENCOUNTER — Encounter: Payer: Self-pay | Admitting: Family Medicine

## 2018-09-15 DIAGNOSIS — M545 Low back pain, unspecified: Secondary | ICD-10-CM

## 2018-09-23 ENCOUNTER — Ambulatory Visit: Payer: 59 | Admitting: Family Medicine

## 2018-09-23 ENCOUNTER — Encounter: Payer: Self-pay | Admitting: Family Medicine

## 2018-09-23 ENCOUNTER — Ambulatory Visit (INDEPENDENT_AMBULATORY_CARE_PROVIDER_SITE_OTHER): Payer: 59

## 2018-09-23 VITALS — BP 124/88 | HR 73 | Temp 98.6°F | Ht 66.0 in | Wt 216.0 lb

## 2018-09-23 DIAGNOSIS — M533 Sacrococcygeal disorders, not elsewhere classified: Secondary | ICD-10-CM
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
## 2018-09-23 NOTE — Patient Instructions (Signed)
Nice to meet you  I will call you with the results from today  You can either try hot or cold  Please try the stretches and exercises  Please see me back after 3-4 weeks of physical therapy.

## 2018-09-23 NOTE — Assessment & Plan Note (Signed)
Pain seems to be more associated with the SI joints.  Likely does have some spasm as a result of this.  Does have some tenderness over the greater trochanter as well.  No suggestion of nerve impingement at this time. -X-ray of lumbar and AP of the pelvis. -Referral to physical therapy. -If no improvement will consider SI joint injection or greater troch injection.

## 2018-09-24 ENCOUNTER — Telehealth: Payer: Self-pay | Admitting: Family Medicine

## 2018-09-24 ENCOUNTER — Encounter: Payer: Self-pay | Admitting: Family Medicine

## 2018-09-24 NOTE — Telephone Encounter (Signed)
Pt given lab results per notes of Dr. Jordan Likes on 09/24/18. Pt verbalized understanding. Pt would like to know what would be recommended to help with back pain and if she is going to have PT. Pt can be contacted back at [phone]

## 2018-09-24 NOTE — Telephone Encounter (Signed)
Left VM for patient. If she calls back please have her speak with a nurse/CMA and inform that his hip xray is norma. Her back xray shows she has transitional anatomy which means can be a normal variant. The [facility] can report results to patient.   If any questions then please take the best time and phone number to call and I will try to call her back.   Myra Rude, MD [facility] 09/24/2018, 5:00 PM

## 2018-09-27 ENCOUNTER — Encounter: Payer: Self-pay | Admitting: Family Medicine

## 2018-09-29 ENCOUNTER — Telehealth: Payer: Self-pay

## 2018-09-29 NOTE — Telephone Encounter (Signed)
Dr. Jordan Likes please advise pt is requesting a call from to discuss her results further.

## 2018-09-29 NOTE — Telephone Encounter (Signed)
Spoke with pt she is aware that referral was placed pt was given Physical therapists number to give them a call.

## 2018-09-29 NOTE — Telephone Encounter (Signed)
Pt calling to ask if Dr Jordan Likes will giver her a call

## 2018-09-29 NOTE — Telephone Encounter (Signed)
Left pt VM to call back regarding referral  Copied from CRM #[id number]. Topic: Referral - Status >> Sep 29, 2018  1:36 PM Crist Infante wrote: Reason for CRM: pt following up a a rerferral that was supposed to be done 09/23/2018 . Pt states she has not heard anything.  Does not have to be [place], can be [place] or [place].

## 2018-09-30 NOTE — Telephone Encounter (Signed)
Spoke with patient.   Myra Rude, MD [facility] 09/30/2018, 2:19 PM

## 2018-10-05 ENCOUNTER — Encounter: Payer: Self-pay | Admitting: Physical Therapy

## 2018-10-05 ENCOUNTER — Other Ambulatory Visit: Payer: Self-pay

## 2018-10-05 ENCOUNTER — Ambulatory Visit: Payer: 59 | Admitting: Physical Therapy

## 2018-10-05 DIAGNOSIS — M545 Low back pain, unspecified: Secondary | ICD-10-CM

## 2018-10-05 DIAGNOSIS — R293 Abnormal posture: Secondary | ICD-10-CM | POA: Diagnosis not present

## 2018-10-05 DIAGNOSIS — G8929 Other chronic pain: Secondary | ICD-10-CM

## 2018-10-05 NOTE — Patient Instructions (Signed)
Access Code: [email or web address]  URL: [email or web address]  Date: 10/05/2018  Prepared by: Moshe Cipro   Exercises  Supine Piriformis Stretch with Foot on Ground - 3 reps - 1 sets - 30 sec hold - 2x daily - 7x weekly  Hooklying Single Knee to Chest - 3 reps - 1 sets - 30 sec hold - 2x daily - 7x weekly  Hooklying Isometric Clamshell - 15 reps - 1 sets - 2x daily - 7x weekly  Standing Glute Med Mobilization with Small Ball on Wall - 1 sets - 3-5 min hold - 2x daily - 7x weekly  Patient Education  TENS Unit  Trigger Point Dry Needling
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
## 2018-10-08 ENCOUNTER — Telehealth: Payer: Self-pay | Admitting: Physical Therapy

## 2018-10-08 ENCOUNTER — Encounter: Payer: 59 | Admitting: Physical Therapy

## 2018-10-08 NOTE — Telephone Encounter (Signed)
Spoke to pt to follow up on PT progress and appointments due to clinic closing.  Pt at this time feels comfortable with exercises provided, and is interested in follow up telephone calls or e-visits.  Will plan to follow up with her during this time.  Clarita Crane, PT, DPT 10/08/18 8:30 PM

## 2018-10-25 ENCOUNTER — Encounter: Payer: 59 | Admitting: Physical Therapy

## 2018-10-28 ENCOUNTER — Telehealth: Payer: Self-pay | Admitting: Physical Therapy

## 2018-10-28 NOTE — Telephone Encounter (Signed)
Olivia Simon was contacted today regarding temporary reduction of Outpatient Rehabilitation Services due to concerns for community transmission of COVID-19.  Patient identity was verified.      The patient was offered and declined the continuation of their plan of care by using a telehealth visit.  Outpatient Rehabilitation Services will follow up with this client when we are able to safely resume care.     Patient is aware we can be reached by telephone during limited business hours in the meantime. (include at end of note)  Olivia Simon, PT, DPT 10/28/18 1:27 PM

## 2018-11-17 ENCOUNTER — Other Ambulatory Visit: Payer: Self-pay | Admitting: Family Medicine

## 2018-12-29 ENCOUNTER — Other Ambulatory Visit: Payer: Self-pay | Admitting: Family Medicine

## 2019-04-13 ENCOUNTER — Other Ambulatory Visit: Payer: Self-pay | Admitting: Family Medicine

## 2019-04-19 ENCOUNTER — Ambulatory Visit: Payer: 59 | Admitting: Physical Therapy

## 2019-05-27 ENCOUNTER — Other Ambulatory Visit: Payer: Self-pay | Admitting: Family Medicine

## 2019-05-27 NOTE — Telephone Encounter (Signed)
[facility] for refill last office visit 08/26/18

## 2019-07-08 ENCOUNTER — Other Ambulatory Visit: Payer: Self-pay | Admitting: Family Medicine

## 2019-07-08 ENCOUNTER — Telehealth: Payer: Self-pay

## 2019-07-08 MED ORDER — TRAZODONE HCL 100 MG PO TABS: 100.0000 mg | ORAL_TABLET | Freq: Every day | ORAL | 3 refills | Status: DC

## 2019-07-08 NOTE — Telephone Encounter (Signed)
Patient is requesting an increase in current dosage for her TRAZODONE 50 MG script. States she has been taking 2 pills nightly.  Please advise

## 2019-07-08 NOTE — Telephone Encounter (Signed)
New Rx sent to pharmacy

## 2019-07-08 NOTE — Addendum Note (Signed)
Addended by: Davis Gourd on: 07/08/2019 01:07 PM   Modules accepted: Orders

## 2019-07-08 NOTE — Telephone Encounter (Signed)
Ok to increase Trazodone to 100mg  nightly, #30, 3 refills

## 2019-07-12 ENCOUNTER — Encounter: Payer: Self-pay | Admitting: Family Medicine

## 2019-08-04 ENCOUNTER — Other Ambulatory Visit: Payer: Self-pay | Admitting: Family Medicine

## 2019-08-06 ENCOUNTER — Encounter: Payer: Self-pay | Admitting: Family Medicine

## 2019-08-12 ENCOUNTER — Encounter: Payer: Self-pay | Admitting: Family Medicine

## 2019-08-12 ENCOUNTER — Other Ambulatory Visit: Payer: Self-pay

## 2019-08-12 ENCOUNTER — Ambulatory Visit (INDEPENDENT_AMBULATORY_CARE_PROVIDER_SITE_OTHER): Payer: BC Managed Care – PPO | Admitting: Family Medicine

## 2019-08-12 VITALS — Ht 65.0 in | Wt 215.0 lb

## 2019-08-12 DIAGNOSIS — Z1231 Encounter for screening mammogram for malignant neoplasm of breast: Secondary | ICD-10-CM | POA: Diagnosis not present

## 2019-08-12 DIAGNOSIS — K5901 Slow transit constipation: Secondary | ICD-10-CM

## 2019-08-12 DIAGNOSIS — E6609 Other obesity due to excess calories: Secondary | ICD-10-CM | POA: Diagnosis not present

## 2019-08-12 DIAGNOSIS — Z6835 Body mass index (BMI) 35.0-35.9, adult: Secondary | ICD-10-CM

## 2019-08-12 DIAGNOSIS — M533 Sacrococcygeal disorders, not elsewhere classified: Secondary | ICD-10-CM

## 2019-08-12 MED ORDER — LINACLOTIDE 72 MCG PO CAPS: 72.0000 ug | ORAL_CAPSULE | Freq: Every day | ORAL | 3 refills | Status: DC
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
## 2019-08-12 NOTE — Progress Notes (Signed)
I have discussed the procedure for the virtual visit with the patient who has given consent to proceed with assessment and treatment.   Pt unable to obtain vitals.   Zhane Donlan L [place], CMA     

## 2019-08-18 ENCOUNTER — Telehealth: Payer: Self-pay

## 2019-08-18 ENCOUNTER — Other Ambulatory Visit: Payer: Self-pay

## 2019-08-18 ENCOUNTER — Ambulatory Visit (INDEPENDENT_AMBULATORY_CARE_PROVIDER_SITE_OTHER): Payer: BC Managed Care – PPO | Admitting: Physical Therapy

## 2019-08-18 ENCOUNTER — Ambulatory Visit (INDEPENDENT_AMBULATORY_CARE_PROVIDER_SITE_OTHER): Payer: BC Managed Care – PPO

## 2019-08-18 DIAGNOSIS — G8929 Other chronic pain: Secondary | ICD-10-CM | POA: Diagnosis not present

## 2019-08-18 DIAGNOSIS — M545 Low back pain, unspecified: Secondary | ICD-10-CM

## 2019-08-18 DIAGNOSIS — Z1231 Encounter for screening mammogram for malignant neoplasm of breast: Secondary | ICD-10-CM

## 2019-08-18 DIAGNOSIS — R293 Abnormal posture: Secondary | ICD-10-CM

## 2019-08-18 NOTE — Telephone Encounter (Signed)
Called pt and LMOVM to return call.  °
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
## 2019-08-18 NOTE — Patient Instructions (Signed)
Access Code: [email or web address]  URL: [email or web address]  Date: 08/18/2019  Prepared by: Solon Palm   Exercises Supine Quadratus Lumborum Stretch - 3 reps - 1 sets - 60 sec hold - 2x daily - 7x weekly Warrior I - 3 reps - 1 sets - 30-60 sec hold - 1x daily - 7x weekly Patient Education Trigger Point Dry Needling

## 2019-08-18 NOTE — Telephone Encounter (Signed)
Noted.  Linzess typically doesn't cause constipation (in fact, it was designed to treat constipation).  I am not sure how long she took the medication but I would recommend she try for at least a week

## 2019-08-18 NOTE — Telephone Encounter (Signed)
Patient called in to informed Dr. Beverely Low that she has had to stop the Linzess. It was causing constipation and foul smelling gas.

## 2019-08-19 NOTE — Telephone Encounter (Signed)
Tried calling pt again. Closing encounter until she returns call.

## 2019-08-19 NOTE — Telephone Encounter (Signed)
Called pt again and LMOVM to return call.  

## 2019-08-22 NOTE — Telephone Encounter (Signed)
Pt called back and I made her aware of the recommendation.   She states that she took the Linzess once a day for a week. She states that she was drinking lots of water while taking the medication.   She said that she would try to the medication again for another 5 days and see how it goes and let us know.

## 2019-08-22 NOTE — Telephone Encounter (Signed)
FYI

## 2019-08-25 ENCOUNTER — Other Ambulatory Visit: Payer: Self-pay

## 2019-08-25 ENCOUNTER — Ambulatory Visit: Payer: BC Managed Care – PPO | Admitting: Physical Therapy

## 2019-08-25 DIAGNOSIS — R293 Abnormal posture: Secondary | ICD-10-CM

## 2019-08-25 DIAGNOSIS — M545 Low back pain: Secondary | ICD-10-CM | POA: Diagnosis not present

## 2019-08-25 DIAGNOSIS — G8929 Other chronic pain: Secondary | ICD-10-CM
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
## 2019-08-25 NOTE — Patient Instructions (Signed)
Access Code: [email or web address]  URL: [email or web address]  Date: 08/25/2019  Prepared by: Raynelle Fanning Laquashia Mergenthaler   Exercises Supine Quadratus Lumborum Stretch - 3 reps - 1 sets - 60 sec hold - 2x daily - 7x weekly Warrior I - 3 reps - 1 sets - 30-60 sec hold - 1x daily - 7x weekly Prone Quadriceps Stretch with Strap - 3 reps - 1 sets - 60 sec hold - 2-3x daily - 7x weekly Patient Education Trigger Point Dry Needling TENS Unit

## 2019-08-31 ENCOUNTER — Ambulatory Visit: Payer: Medicaid Other | Admitting: Physical Therapy

## 2019-10-28 ENCOUNTER — Telehealth (INDEPENDENT_AMBULATORY_CARE_PROVIDER_SITE_OTHER): Payer: BC Managed Care – PPO | Admitting: Family Medicine

## 2019-10-28 ENCOUNTER — Other Ambulatory Visit: Payer: Self-pay

## 2019-10-28 ENCOUNTER — Encounter: Payer: Self-pay | Admitting: Family Medicine

## 2019-10-28 VITALS — Ht 65.0 in | Wt 217.0 lb

## 2019-10-28 DIAGNOSIS — E669 Obesity, unspecified: Secondary | ICD-10-CM | POA: Diagnosis not present

## 2019-10-28 NOTE — Progress Notes (Signed)
I have discussed the procedure for the virtual visit with the patient who has given consent to proceed with assessment and treatment.   Pt unable to obtain full vitals.   Olivia Simon, CMA     
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
## 2019-11-30 ENCOUNTER — Encounter: Payer: Self-pay | Admitting: Family Medicine

## 2019-11-30 ENCOUNTER — Telehealth (INDEPENDENT_AMBULATORY_CARE_PROVIDER_SITE_OTHER): Payer: BC Managed Care – PPO | Admitting: Family Medicine

## 2019-11-30 ENCOUNTER — Other Ambulatory Visit: Payer: Self-pay

## 2019-11-30 DIAGNOSIS — G4733 Obstructive sleep apnea (adult) (pediatric): Secondary | ICD-10-CM | POA: Diagnosis not present

## 2019-11-30 DIAGNOSIS — G4700 Insomnia, unspecified: Secondary | ICD-10-CM | POA: Diagnosis not present

## 2019-11-30 DIAGNOSIS — K469 Unspecified abdominal hernia without obstruction or gangrene: Secondary | ICD-10-CM

## 2019-11-30 NOTE — Progress Notes (Signed)
I have discussed the procedure for the virtual visit with the patient who has given consent to proceed with assessment and treatment.   Pt unable to obtain vitals.  Would like to discuss ongoing insomnia and also possible referral for hernia.   Geannie Risen, CMA
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
## 2019-12-05 DIAGNOSIS — R635 Abnormal weight gain: Secondary | ICD-10-CM | POA: Diagnosis not present

## 2019-12-05 DIAGNOSIS — Z6835 Body mass index (BMI) 35.0-35.9, adult: Secondary | ICD-10-CM | POA: Diagnosis not present

## 2019-12-05 DIAGNOSIS — E559 Vitamin D deficiency, unspecified: Secondary | ICD-10-CM | POA: Diagnosis not present

## 2019-12-05 DIAGNOSIS — Z131 Encounter for screening for diabetes mellitus: Secondary | ICD-10-CM | POA: Diagnosis not present

## 2019-12-05 DIAGNOSIS — E669 Obesity, unspecified: Secondary | ICD-10-CM | POA: Diagnosis not present

## 2019-12-05 DIAGNOSIS — Z13220 Encounter for screening for lipoid disorders: Secondary | ICD-10-CM | POA: Diagnosis not present

## 2020-01-27 ENCOUNTER — Telehealth: Payer: Self-pay | Admitting: Family Medicine

## 2020-01-27 DIAGNOSIS — M533 Sacrococcygeal disorders, not elsewhere classified: Secondary | ICD-10-CM

## 2020-01-27 DIAGNOSIS — M545 Low back pain, unspecified: Secondary | ICD-10-CM

## 2020-01-27 NOTE — Telephone Encounter (Signed)
Patient is calling in stating that she is still having the back pain and feels as if the PT did not help. Tried offering a follow up appointment, patient declined states she just wants to know what else she can do.

## 2020-01-30 NOTE — Telephone Encounter (Signed)
Please advise 

## 2020-01-30 NOTE — Addendum Note (Signed)
Addended by: Yvone Neu L on: 01/30/2020 11:04 AM   Modules accepted: Orders

## 2020-01-30 NOTE — Telephone Encounter (Signed)
Since pt is still having pain after both PT and sports med evaluations, the next step is orthopedic referral for the back pain

## 2020-01-30 NOTE — Telephone Encounter (Signed)
Noted  Referral placed.

## 2020-02-24 DIAGNOSIS — Z711 Person with feared health complaint in whom no diagnosis is made: Secondary | ICD-10-CM | POA: Diagnosis not present

## 2020-03-20 ENCOUNTER — Ambulatory Visit: Payer: Medicaid Other | Admitting: Orthopaedic Surgery

## 2020-03-31 ENCOUNTER — Encounter: Payer: Self-pay | Admitting: Emergency Medicine

## 2020-03-31 ENCOUNTER — Telehealth: Payer: Self-pay | Admitting: Emergency Medicine

## 2020-03-31 ENCOUNTER — Other Ambulatory Visit: Payer: Self-pay

## 2020-03-31 ENCOUNTER — Emergency Department (INDEPENDENT_AMBULATORY_CARE_PROVIDER_SITE_OTHER)
Admission: EM | Admit: 2020-03-31 | Discharge: 2020-03-31 | Disposition: A | Discharge status: Home / Self Care | Payer: BC Managed Care – PPO | Source: Home / Self Care

## 2020-03-31 DIAGNOSIS — U071 COVID-19: Secondary | ICD-10-CM | POA: Diagnosis not present

## 2020-03-31 LAB — POC SARS CORONAVIRUS 2 AG -  ED: SARS Coronavirus 2 Ag: POSITIVE — AB

## 2020-03-31 MED ORDER — HYDROCODONE-HOMATROPINE 5-1.5 MG/5ML PO SYRP: 5.0000 mL | ORAL_SOLUTION | Freq: Four times a day (QID) | ORAL | 0 refills | Status: DC | PRN
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
## 2020-03-31 NOTE — Telephone Encounter (Signed)
Call back - 2 identifiers confirmed - Olivia Simon has the same symptoms as her son who was diagnosed previously w/ COVID. They sleep in the same room. Covis test required by her employer. Pt plans to come in today for a test.

## 2020-03-31 NOTE — ED Triage Notes (Addendum)
Pt's son tested + for covid on Labor Day, pt now has COVID symptoms  X 3 days (cough, sore throat, felt hot at home - no thermometer) Pt states she took 1000mg  of tylenol at 0815 because she felt hot and clammy, beads of sweat noted on forehead in triage Pt here to be tested NO COVID vaccine

## 2020-03-31 NOTE — Discharge Instructions (Signed)
Strategies to prevent and/or treat COVID-19: ° °Vitamin D3 5000 IU (125 mcg) daily °Vitamin C 500 mg twice daily °Zinc 50 to 75 mg daily ° °Pepcid 20 mg twice a day ° °Listerine type mouthwash 4 times a day ° ° ° °

## 2020-04-01 ENCOUNTER — Telehealth: Payer: Self-pay | Admitting: Unknown Physician Specialty

## 2020-04-01 NOTE — Telephone Encounter (Signed)
Called to Discuss with patient about Covid symptoms and the use of the monoclonal antibody infusion for those with mild to moderate Covid symptoms and at a high risk of hospitalization.     Pt is qualified for this infusion due to co-morbid conditions and/or a member of an at-risk group.     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 Symptoms tier reviewed as well as criteria for ending isolation. Preventative practices reviewed. Patient verbalized understanding.    Patient advised to call back if he/she decides that he/she does want to get infusion. Callback number to the infusion center given. Patient advised to go to Urgent care or ED with severe symptoms.

## 2020-04-09 DIAGNOSIS — Z03818 Encounter for observation for suspected exposure to other biological agents ruled out: Secondary | ICD-10-CM | POA: Diagnosis not present

## 2020-04-11 ENCOUNTER — Telehealth: Payer: Self-pay

## 2020-04-11 NOTE — Telephone Encounter (Signed)
Pt called saying she is still having a sore throat. Tested pos for covid on day of UC visit 9/11. Advised pt to f/u with pcp for further eval. Pt states pcp wont see her, advised televisit but she would rather be seen in person. Will come back to UC for recheck of sxs.

## 2020-04-12 ENCOUNTER — Other Ambulatory Visit: Payer: Self-pay

## 2020-04-12 ENCOUNTER — Telehealth: Payer: BC Managed Care – PPO | Admitting: Family Medicine

## 2020-04-12 ENCOUNTER — Emergency Department (INDEPENDENT_AMBULATORY_CARE_PROVIDER_SITE_OTHER)
Admission: RE | Admit: 2020-04-12 | Discharge: 2020-04-12 | Disposition: A | Discharge status: Home / Self Care | Payer: BC Managed Care – PPO | Source: Ambulatory Visit

## 2020-04-12 VITALS — BP 123/85 | HR 88 | Temp 98.2°F | Ht 65.0 in | Wt 195.0 lb

## 2020-04-12 DIAGNOSIS — R0982 Postnasal drip: Secondary | ICD-10-CM

## 2020-04-12 DIAGNOSIS — J020 Streptococcal pharyngitis: Secondary | ICD-10-CM | POA: Diagnosis not present

## 2020-04-12 DIAGNOSIS — J392 Other diseases of pharynx: Secondary | ICD-10-CM

## 2020-04-12 DIAGNOSIS — R059 Cough, unspecified: Secondary | ICD-10-CM

## 2020-04-12 LAB — POCT RAPID STREP A (OFFICE): Rapid Strep A Screen: POSITIVE — AB

## 2020-04-12 MED ORDER — IPRATROPIUM BROMIDE 0.06 % NA SOLN: 2.0000 | Freq: Four times a day (QID) | NASAL | 1 refills | Status: DC

## 2020-04-12 MED ORDER — BENZONATATE 100 MG PO CAPS: 100.0000 mg | ORAL_CAPSULE | Freq: Three times a day (TID) | ORAL | 0 refills | Status: DC

## 2020-04-12 MED ORDER — AMOXICILLIN 500 MG PO CAPS: 500.0000 mg | ORAL_CAPSULE | Freq: Two times a day (BID) | ORAL | 0 refills | Status: AC

## 2020-04-12 NOTE — ED Triage Notes (Signed)
Sore throat, tested postive for Covid on 9/11 still having "throat issues" Not vaccinated
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
## 2020-04-12 NOTE — Discharge Instructions (Addendum)
You did test positive for strep pharyngitis today. You have been prescribed an antibiotic, amoxicillin. Be sure to take the entire 10 day course of the antibiotic even if you start to feel better to make sure all the bacteria has been killed so the infection does not come back.   Coughs from viral illnesses can last weeks to months. You may try the prescribed nasal spray and cough pills to help with symptoms. You can also try sinus rinses, warm saltwater gargles, running a humidifier at night and propping yourself up on some pillows to help with throat irritation and post-nasal drainage which seems to be triggering your cough.  Please call to schedule a follow up appointment with your primary care provider or with the [facility] if not improving in 1-2 weeks, sooner if you develop worsening symptoms- fever, trouble breathing or swallowing or going to the hospital if symptoms become severe.

## 2020-04-27 ENCOUNTER — Telehealth: Payer: Self-pay | Admitting: Family Medicine

## 2020-04-27 DIAGNOSIS — K649 Unspecified hemorrhoids: Secondary | ICD-10-CM

## 2020-04-27 DIAGNOSIS — Z1211 Encounter for screening for malignant neoplasm of colon: Secondary | ICD-10-CM

## 2020-04-27 NOTE — Telephone Encounter (Signed)
Patient would like to be referred to doctor for her hemmorhoids - She prefers to have a female doctor if at all possible.

## 2020-04-30 NOTE — Telephone Encounter (Signed)
Referral placed.

## 2020-04-30 NOTE — Telephone Encounter (Signed)
Please advise pt is 73 and also has never had a colonoscopy

## 2020-04-30 NOTE — Telephone Encounter (Signed)
Ok for GI referral, can specify female provider.  Dx hemorrhoids, colon cancer screen

## 2020-04-30 NOTE — Telephone Encounter (Signed)
Sent to [facility], pt is aware of this

## 2020-06-27 ENCOUNTER — Ambulatory Visit: Payer: BC Managed Care – PPO | Admitting: Gastroenterology

## 2020-09-18 ENCOUNTER — Other Ambulatory Visit: Payer: Self-pay | Admitting: Family Medicine

## 2020-09-24 DIAGNOSIS — Z124 Encounter for screening for malignant neoplasm of cervix: Secondary | ICD-10-CM | POA: Diagnosis not present

## 2020-09-24 DIAGNOSIS — N959 Unspecified menopausal and perimenopausal disorder: Secondary | ICD-10-CM | POA: Diagnosis not present

## 2020-09-24 DIAGNOSIS — Z113 Encounter for screening for infections with a predominantly sexual mode of transmission: Secondary | ICD-10-CM | POA: Diagnosis not present

## 2020-09-24 DIAGNOSIS — Z1151 Encounter for screening for human papillomavirus (HPV): Secondary | ICD-10-CM | POA: Diagnosis not present

## 2020-09-24 DIAGNOSIS — Z118 Encounter for screening for other infectious and parasitic diseases: Secondary | ICD-10-CM | POA: Diagnosis not present

## 2020-09-24 DIAGNOSIS — R87610 Atypical squamous cells of undetermined significance on cytologic smear of cervix (ASC-US): Secondary | ICD-10-CM | POA: Diagnosis not present

## 2020-10-08 DIAGNOSIS — N888 Other specified noninflammatory disorders of cervix uteri: Secondary | ICD-10-CM | POA: Diagnosis not present

## 2020-10-08 DIAGNOSIS — D259 Leiomyoma of uterus, unspecified: Secondary | ICD-10-CM | POA: Diagnosis not present

## 2020-10-08 DIAGNOSIS — N958 Other specified menopausal and perimenopausal disorders: Secondary | ICD-10-CM | POA: Diagnosis not present

## 2020-10-08 DIAGNOSIS — D251 Intramural leiomyoma of uterus: Secondary | ICD-10-CM | POA: Diagnosis not present

## 2020-11-16 DIAGNOSIS — K649 Unspecified hemorrhoids: Secondary | ICD-10-CM | POA: Diagnosis not present

## 2020-11-16 DIAGNOSIS — K219 Gastro-esophageal reflux disease without esophagitis: Secondary | ICD-10-CM | POA: Diagnosis not present

## 2020-11-16 DIAGNOSIS — E669 Obesity, unspecified: Secondary | ICD-10-CM | POA: Diagnosis not present

## 2020-11-16 DIAGNOSIS — K589 Irritable bowel syndrome without diarrhea: Secondary | ICD-10-CM | POA: Diagnosis not present

## 2020-11-16 DIAGNOSIS — I10 Essential (primary) hypertension: Secondary | ICD-10-CM | POA: Diagnosis not present

## 2020-11-16 DIAGNOSIS — G4733 Obstructive sleep apnea (adult) (pediatric): Secondary | ICD-10-CM | POA: Diagnosis not present

## 2020-11-29 DIAGNOSIS — Z1231 Encounter for screening mammogram for malignant neoplasm of breast: Secondary | ICD-10-CM | POA: Diagnosis not present

## 2020-12-27 DIAGNOSIS — H15001 Unspecified scleritis, right eye: Secondary | ICD-10-CM | POA: Diagnosis not present

## 2021-01-16 ENCOUNTER — Encounter: Payer: Self-pay | Admitting: *Deleted

## 2021-01-31 DIAGNOSIS — L209 Atopic dermatitis, unspecified: Secondary | ICD-10-CM | POA: Diagnosis not present

## 2021-01-31 DIAGNOSIS — L299 Pruritus, unspecified: Secondary | ICD-10-CM | POA: Diagnosis not present

## 2021-02-01 DIAGNOSIS — Z91040 Latex allergy status: Secondary | ICD-10-CM | POA: Diagnosis not present

## 2021-02-01 DIAGNOSIS — H15003 Unspecified scleritis, bilateral: Secondary | ICD-10-CM | POA: Diagnosis not present

## 2021-02-01 DIAGNOSIS — Z888 Allergy status to other drugs, medicaments and biological substances status: Secondary | ICD-10-CM | POA: Diagnosis not present

## 2021-02-21 DIAGNOSIS — L299 Pruritus, unspecified: Secondary | ICD-10-CM | POA: Diagnosis not present

## 2021-02-21 DIAGNOSIS — L209 Atopic dermatitis, unspecified: Secondary | ICD-10-CM | POA: Diagnosis not present

## 2021-04-05 DIAGNOSIS — Z111 Encounter for screening for respiratory tuberculosis: Secondary | ICD-10-CM | POA: Diagnosis not present

## 2021-04-07 DIAGNOSIS — Z111 Encounter for screening for respiratory tuberculosis: Secondary | ICD-10-CM | POA: Diagnosis not present

## 2021-04-26 DIAGNOSIS — I10 Essential (primary) hypertension: Secondary | ICD-10-CM | POA: Diagnosis not present

## 2021-04-26 DIAGNOSIS — R1033 Periumbilical pain: Secondary | ICD-10-CM | POA: Diagnosis not present

## 2021-04-29 ENCOUNTER — Telehealth: Payer: Self-pay

## 2021-04-29 NOTE — Telephone Encounter (Signed)
Transition Care Management Follow-up Telephone Call Date of discharge and from where: 04/26/2021 from [facility] How have you been since you were released from the hospital? Pt stated that she is feeling rough. Pt is able to eat and drink, but she is not able to eat much.  Any questions or concerns? No  Items Reviewed: Did the pt receive and understand the discharge instructions provided? Yes  Medications obtained and verified? Yes  Other? No  Any new allergies since your discharge? No  Dietary orders reviewed? No Do you have support at home? Yes   Functional Questionnaire: (I = Independent and D = Dependent) ADLs: I  Bathing/Dressing- I  Meal Prep- I  Eating- I  Maintaining continence- I  Transferring/Ambulation- I  Managing Meds- I   Follow up appointments reviewed:  PCP Hospital f/u appt confirmed? No   Specialist Hospital f/u appt confirmed? No   Are transportation arrangements needed? No  If their condition worsens, is the pt aware to call PCP or go to the Emergency Dept.? Yes Was the patient provided with contact information for the PCP's office or ED? Yes Was to pt encouraged to call back with questions or concerns? Yes

## 2021-05-02 DIAGNOSIS — Z7689 Persons encountering health services in other specified circumstances: Secondary | ICD-10-CM | POA: Diagnosis not present

## 2021-05-02 DIAGNOSIS — E559 Vitamin D deficiency, unspecified: Secondary | ICD-10-CM | POA: Insufficient documentation

## 2021-05-02 DIAGNOSIS — R109 Unspecified abdominal pain: Secondary | ICD-10-CM | POA: Diagnosis not present

## 2021-05-02 DIAGNOSIS — K581 Irritable bowel syndrome with constipation: Secondary | ICD-10-CM | POA: Diagnosis not present

## 2021-05-02 DIAGNOSIS — R5383 Other fatigue: Secondary | ICD-10-CM | POA: Diagnosis not present

## 2021-05-03 DIAGNOSIS — R109 Unspecified abdominal pain: Secondary | ICD-10-CM | POA: Diagnosis not present

## 2021-06-06 DIAGNOSIS — L819 Disorder of pigmentation, unspecified: Secondary | ICD-10-CM | POA: Diagnosis not present

## 2021-08-07 IMAGING — MG DIGITAL SCREENING BILAT W/ TOMO W/ CAD
6 of 12 series · 6 of 36 positions shown · non-contrast
Comparison: Previous exam(s).

CLINICAL DATA: Screening.

EXAM:
DIGITAL SCREENING BILATERAL MAMMOGRAM WITH TOMO AND CAD

[L MLO synth-2D (1 of 2)]
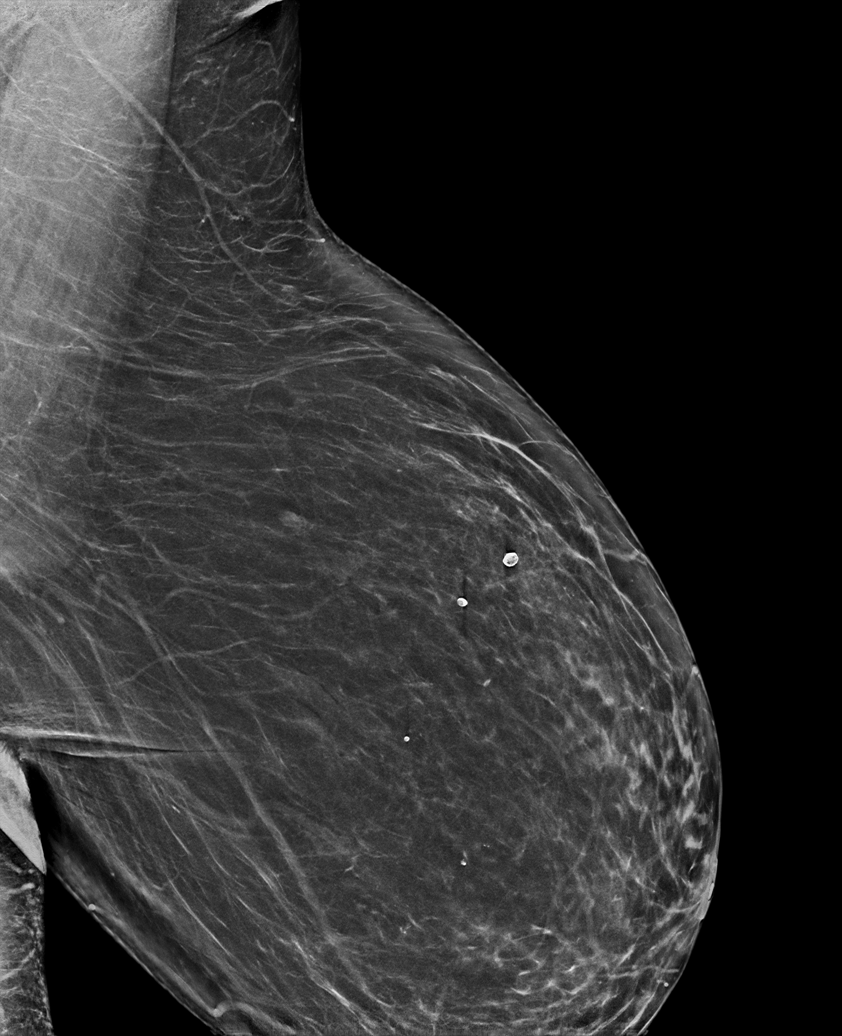

[R CC synth-2D]
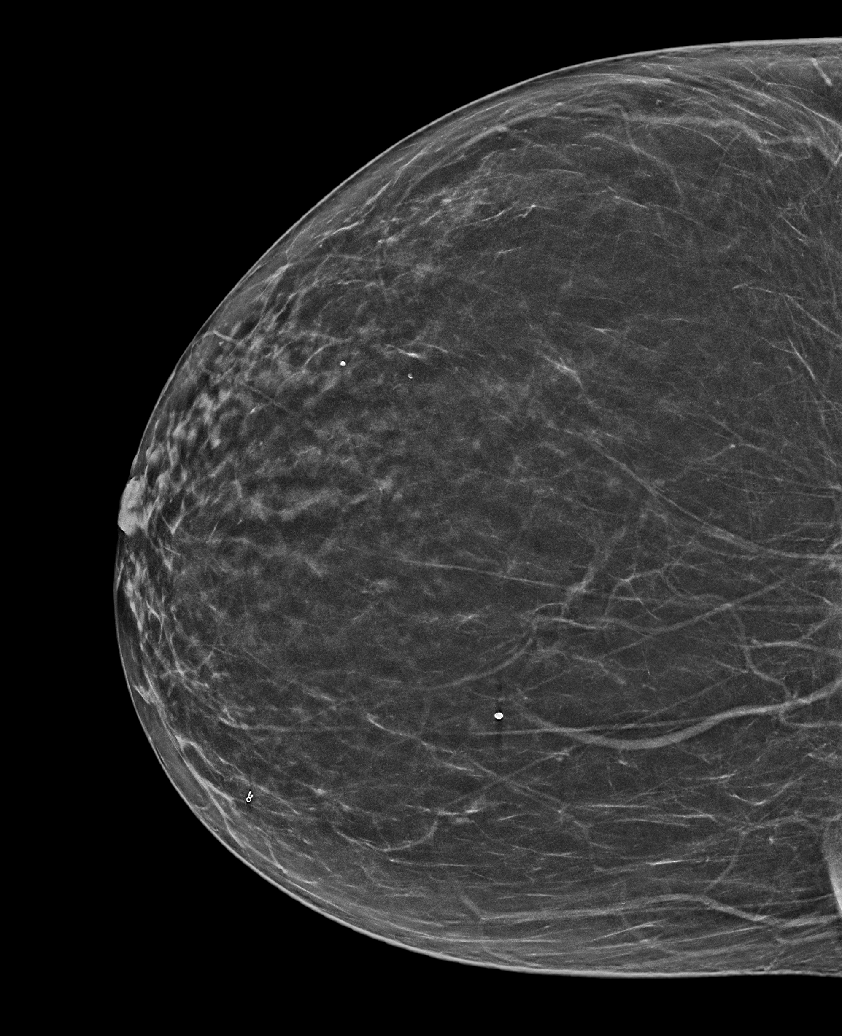

[L MLO synth-2D (2 of 2)]
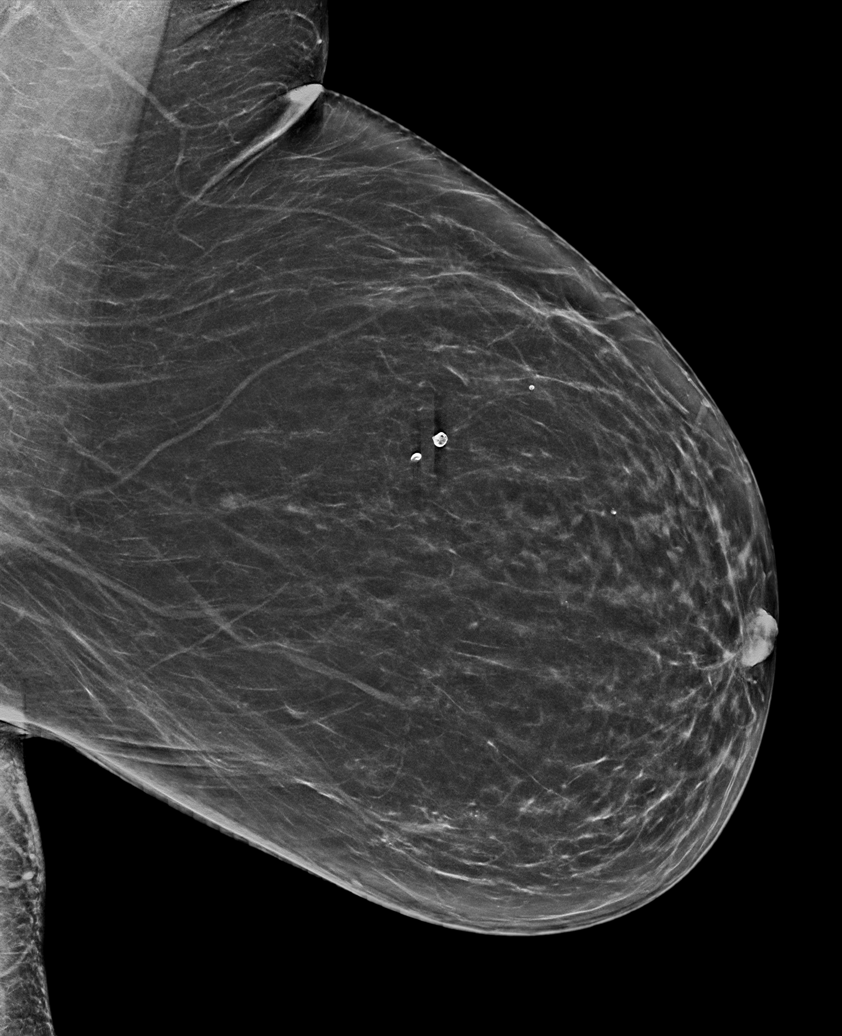

[L CC synth-2D]
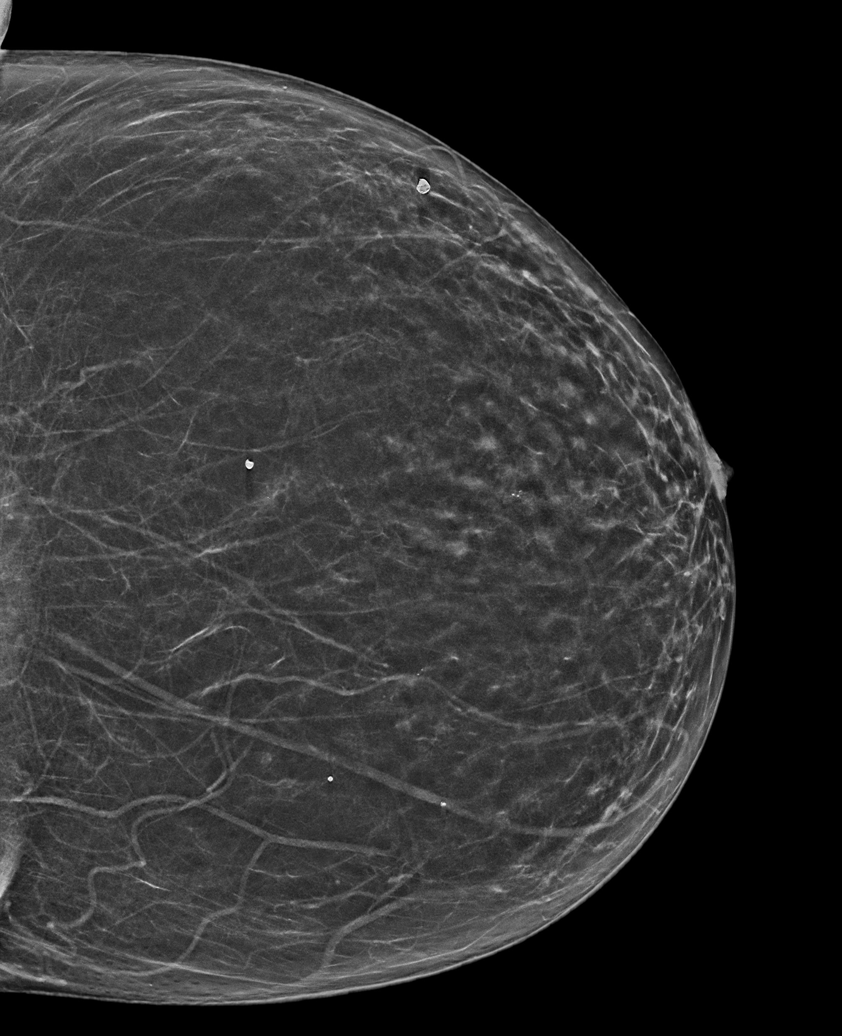

[R MLO synth-2D (1 of 2)]
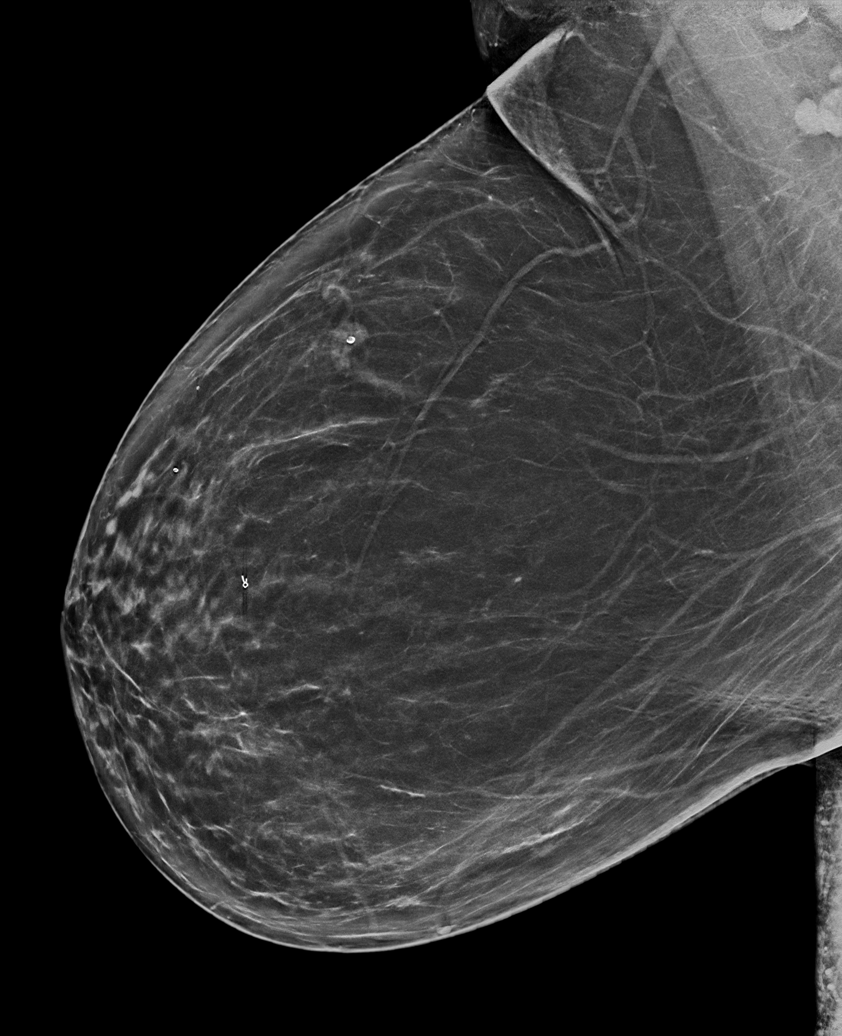

[R MLO synth-2D (2 of 2)]
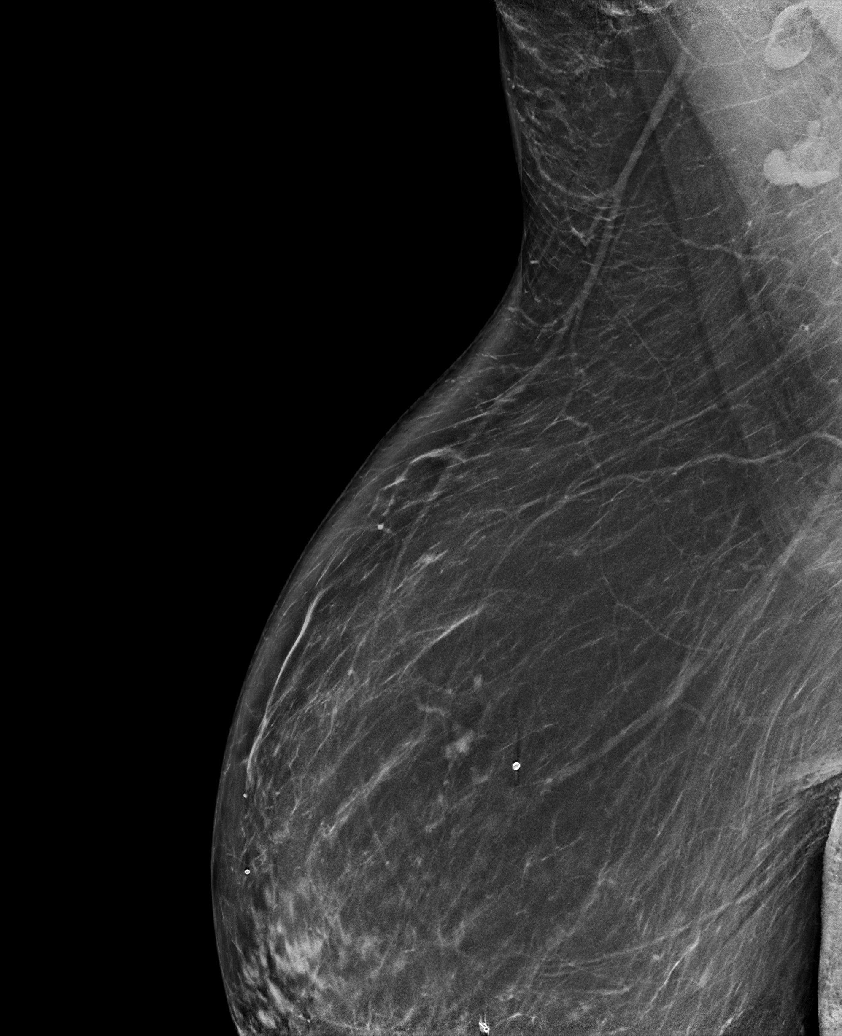

[6 of 36 positions shown; findings below may reference images not displayed]

ACR Breast Density Category b: There are scattered areas of
fibroglandular density.
FINDINGS: There are no findings suspicious for malignancy. Images were
processed with CAD.
IMPRESSION: No mammographic evidence of malignancy. A result letter of this
screening mammogram will be mailed directly to the patient.

RECOMMENDATION:
Screening mammogram in one year. (Code:[id number])

BI-RADS CATEGORY  1: Negative.

## 2021-08-16 ENCOUNTER — Ambulatory Visit (INDEPENDENT_AMBULATORY_CARE_PROVIDER_SITE_OTHER): Payer: Managed Care, Other (non HMO) | Admitting: Family Medicine

## 2021-08-16 ENCOUNTER — Encounter: Payer: Self-pay | Admitting: Family Medicine

## 2021-08-16 VITALS — BP 118/82 | HR 85 | Temp 97.7°F | Resp 16 | Ht 65.0 in | Wt 214.8 lb

## 2021-08-16 DIAGNOSIS — Z1159 Encounter for screening for other viral diseases: Secondary | ICD-10-CM | POA: Diagnosis not present

## 2021-08-16 DIAGNOSIS — E6609 Other obesity due to excess calories: Secondary | ICD-10-CM

## 2021-08-16 DIAGNOSIS — E559 Vitamin D deficiency, unspecified: Secondary | ICD-10-CM

## 2021-08-16 DIAGNOSIS — Z6835 Body mass index (BMI) 35.0-35.9, adult: Secondary | ICD-10-CM | POA: Diagnosis not present

## 2021-08-16 DIAGNOSIS — Z0000 Encounter for general adult medical examination without abnormal findings: Secondary | ICD-10-CM | POA: Diagnosis not present

## 2021-08-16 NOTE — Progress Notes (Signed)
° °  Subjective:    Patient ID: Olivia Simon, female    DOB: 1971/04/01, 51 y.o.   MRN: [id number]  HPI CPE- due for mammo (11/29/20) , pap (Dr Carlota Raspberry), colonoscopy [facility]).  Declines Tdap and flu  Health Maintenance  Topic Date Due   Hepatitis C Screening  Never done   TETANUS/TDAP  Never done   Zoster Vaccines- Shingrix (1 of 2) Never done   COVID-19 Vaccine (3 - Booster for Pfizer series) 09/27/2020   PAP SMEAR-Modifier  05/05/2021   INFLUENZA VACCINE  10/18/2021 (Originally 02/18/2021)   MAMMOGRAM  11/29/2021   COLONOSCOPY (Pts 45-58yrs Insurance coverage will need to be confirmed)  11/16/2029   HIV Screening  Completed   HPV VACCINES  Aged Out      Review of Systems Patient reports no vision/ hearing changes, adenopathy,fever, weight change,  persistant/recurrent hoarseness , swallowing issues, chest pain, palpitations, edema, persistant/recurrent cough, hemoptysis, dyspnea (rest/exertional/paroxysmal nocturnal), gastrointestinal bleeding (melena, rectal bleeding), abdominal pain, significant heartburn, bowel changes, GU symptoms (dysuria, hematuria, incontinence), Gyn symptoms (abnormal  bleeding, pain),  syncope, focal weakness, memory loss, numbness & tingling, skin/hair/nail changes, abnormal bruising or bleeding, anxiety, or depression.   This visit occurred during the SARS-CoV-2 public health emergency.  Safety protocols were in place, including screening questions prior to the visit, additional usage of staff PPE, and extensive cleaning of exam room while observing appropriate contact time as indicated for disinfecting solutions.      Objective:   Physical Exam General Appearance:    Alert, cooperative, no distress, appears stated age, obese  Head:    Normocephalic, without obvious abnormality, atraumatic  Eyes:    PERRL, conjunctiva/corneas clear, EOM's intact, fundi    benign, both eyes  Ears:    Normal TM's and external ear canals, both ears  Nose:   Deferred  due to COVID  Throat:   Neck:   Supple, symmetrical, trachea midline, no adenopathy;    Thyroid: no enlargement/tenderness/nodules  Back:     Symmetric, no curvature, ROM normal, no CVA tenderness  Lungs:     Clear to auscultation bilaterally, respirations unlabored  Chest Wall:    No tenderness or deformity   Heart:    Regular rate and rhythm, S1 and S2 normal, no murmur, rub   or gallop  Breast Exam:    Deferred to mammo  Abdomen:     Soft, non-tender, bowel sounds active all four quadrants,    no masses, no organomegaly  Genitalia:    Deferred to GYN  Rectal:    Extremities:   Extremities normal, atraumatic, no cyanosis or edema  Pulses:   2+ and symmetric all extremities  Skin:   Skin color, texture, turgor normal, no rashes or lesions  Lymph nodes:   Cervical, supraclavicular, and axillary nodes normal  Neurologic:   CNII-XII intact, normal strength, sensation and reflexes    throughout          Assessment & Plan:

## 2021-08-16 NOTE — Assessment & Plan Note (Signed)
Pt has hx of this.  Check labs and replete prn. 

## 2021-08-16 NOTE — Assessment & Plan Note (Signed)
Pt's PE WNL w/ exception of obesity.  Pt is UTD on colonoscopy and mammo.  Reports she is UTD on pap w/ Dr Paralee Cancel office (will try and locate).  Declines flu and Tdap.  Check labs.  Anticipatory guidance provided.

## 2021-08-16 NOTE — Assessment & Plan Note (Signed)
Pt's BMI 35.74  Stressed need for healthy diet and regular exercise.  Check labs to risk stratify.  Will follow.

## 2021-08-16 NOTE — Patient Instructions (Addendum)
Follow up in 1 year or as needed We'll notify you of your lab results and make any changes if needed Continue to work on healthy diet and regular exercise- you can do it! Call with any questions or concerns Stay Safe!  Stay Healthy! Have a great weekend!!!

## 2021-08-19 LAB — CBC WITH DIFFERENTIAL/PLATELET
Absolute Monocytes: 457 cells/uL (ref 200–950)
Basophils Absolute: 22 cells/uL (ref 0–200)
Basophils Relative: 0.4 %
Eosinophils Absolute: 138 cells/uL (ref 15–500)
Eosinophils Relative: 2.5 %
HCT: 38.9 % (ref 35.0–45.0)
Hemoglobin: 12.9 g/dL (ref 11.7–15.5)
Lymphs Abs: 1958 cells/uL (ref 850–3900)
MCH: 28 pg (ref 27.0–33.0)
MCHC: 33.2 g/dL (ref 32.0–36.0)
MCV: 84.4 fL (ref 80.0–100.0)
MPV: 11 fL (ref 7.5–12.5)
Monocytes Relative: 8.3 %
Neutro Abs: 2926 cells/uL (ref 1500–7800)
Neutrophils Relative %: 53.2 %
Platelets: 269 10*3/uL (ref 140–400)
RBC: 4.61 10*6/uL (ref 3.80–5.10)
RDW: 13.1 % (ref 11.0–15.0)
Total Lymphocyte: 35.6 %
WBC: 5.5 10*3/uL (ref 3.8–10.8)

## 2021-08-19 LAB — BASIC METABOLIC PANEL
BUN: 11 mg/dL (ref 7–25)
CO2: 23 mmol/L (ref 20–32)
Calcium: 8.6 mg/dL (ref 8.6–10.4)
Chloride: 104 mmol/L (ref 98–110)
Creat: 0.83 mg/dL (ref 0.50–1.03)
Glucose, Bld: 105 mg/dL — ABNORMAL HIGH (ref 65–99)
Potassium: 4 mmol/L (ref 3.5–5.3)
Sodium: 139 mmol/L (ref 135–146)

## 2021-08-19 LAB — LIPID PANEL
Cholesterol: 157 mg/dL (ref <200)
HDL: 34 mg/dL — ABNORMAL LOW (ref >=50)
LDL Cholesterol (Calc): 99 mg/dL (calc)
Non-HDL Cholesterol (Calc): 123 mg/dL (calc) (ref <130)
Total CHOL/HDL Ratio: 4.6 (calc) (ref <5.0)
Triglycerides: 143 mg/dL (ref <150)

## 2021-08-19 LAB — HEPATIC FUNCTION PANEL
AG Ratio: 1 (calc) (ref 1.0–2.5)
ALT: 16 U/L (ref 6–29)
AST: 15 U/L (ref 10–35)
Albumin: 3.7 g/dL (ref 3.6–5.1)
Alkaline phosphatase (APISO): 98 U/L (ref 37–153)
Bilirubin, Direct: 0.1 mg/dL (ref 0.0–0.2)
Globulin: 3.6 g/dL (calc) (ref 1.9–3.7)
Indirect Bilirubin: 0.2 mg/dL (calc) (ref 0.2–1.2)
Total Bilirubin: 0.3 mg/dL (ref 0.2–1.2)
Total Protein: 7.3 g/dL (ref 6.1–8.1)

## 2021-08-19 LAB — HEPATITIS C ANTIBODY
Hepatitis C Ab: NONREACTIVE
SIGNAL TO CUT-OFF: 0.16 (ref <1.00)

## 2021-08-19 LAB — TSH: TSH: 0.73 mIU/L

## 2021-08-19 LAB — VITAMIN D 25 HYDROXY (VIT D DEFICIENCY, FRACTURES): Vit D, 25-Hydroxy: 36 ng/mL (ref 30–100)

## 2021-09-26 ENCOUNTER — Encounter: Payer: Self-pay | Admitting: Family Medicine

## 2021-09-26 MED ORDER — IBUPROFEN 800 MG PO TABS: 800.0000 mg | ORAL_TABLET | Freq: Three times a day (TID) | ORAL | 0 refills | Status: DC | PRN

## 2021-10-30 ENCOUNTER — Ambulatory Visit (INDEPENDENT_AMBULATORY_CARE_PROVIDER_SITE_OTHER): Payer: Managed Care, Other (non HMO) | Admitting: Registered Nurse

## 2021-10-30 ENCOUNTER — Encounter: Payer: Self-pay | Admitting: Registered Nurse

## 2021-10-30 VITALS — BP 148/108 | HR 78 | Temp 98.0°F | Resp 16 | Ht 65.0 in | Wt 214.6 lb

## 2021-10-30 DIAGNOSIS — I10 Essential (primary) hypertension: Secondary | ICD-10-CM

## 2021-10-30 MED ORDER — AMLODIPINE BESYLATE 10 MG PO TABS: 10.0000 mg | ORAL_TABLET | Freq: Every day | ORAL | 1 refills | Status: DC

## 2021-10-30 NOTE — Assessment & Plan Note (Signed)
Start amlodipine 10mg  po qd ?Reviewed risks, benefits, and side effects, pt voices understanding. ?Follow up in 2 weeks for nurse visit bp, 3-6 mo with PCP  ?

## 2021-10-30 NOTE — Patient Instructions (Signed)
Ms. Olivia Simon - ? ?Great to meet you! ? ?Start amlodipine 10mg  daily ? ?Return for bp check in 2-3 weeks nurse visit only ? ?See Dr. in 3-6 mo to check in on this. ? ?See below for info on blood pressure control ? ?Thank you, ? ? ?Rich  ?

## 2021-10-30 NOTE — Progress Notes (Signed)
? ?Established Patient Office Visit ? ?Subjective:  ?Patient ID: Olivia Simon, female    DOB: 1971-03-29  Age: 51 y.o. MRN: [id number] ? ?CC:  ?Chief Complaint  ?Patient presents with  ? Hypertension  ?  Pt reports BP last night 140/107 earlier today was 151/111  ? ? ?HPI ?Olivia Simon presents for HTN ? ?Home checks of 140/107 and 151/111 ?Has had some headaches. ?No visual changes, palpitations, claudication, dependent edema, shob ? ?Has not been on medication ? ?Notes sister with HTN ? ?Outpatient Medications Prior to Visit  ?Medication Sig Dispense Refill  ? B Complex Vitamins (VITAMIN B-COMPLEX) TABS Take 1 tablet by mouth daily.    ? ibuprofen (ADVIL) 800 MG tablet Take 1 tablet (800 mg total) by mouth every 8 (eight) hours as needed. 45 tablet 0  ? ?No facility-administered medications prior to visit.  ? ? ?Review of Systems  ?Constitutional: Negative.   ?HENT: Negative.    ?Eyes: Negative.   ?Respiratory: Negative.    ?Cardiovascular: Negative.   ?Gastrointestinal: Negative.   ?Genitourinary: Negative.   ?Musculoskeletal: Negative.   ?Skin: Negative.   ?Neurological: Negative.   ?Psychiatric/Behavioral: Negative.    ?All other systems reviewed and are negative. ? ?  ?Objective:  ?  ? ?BP (!) 148/108   Pulse 78   Temp 98 ?F (36.7 ?C) (Temporal)   Resp 16   Ht 5\' 5"  (1.651 m)   Wt 214 lb 9.6 oz (97.3 kg)   SpO2 97%   BMI 35.71 kg/m?  ? ?Wt Readings from Last 3 Encounters:  ?10/30/21 214 lb 9.6 oz (97.3 kg)  ?08/16/21 214 lb 12.8 oz (97.4 kg)  ?04/12/20 195 lb (88.5 kg)  ? ?Physical Exam ?Vitals and nursing note reviewed.  ?Constitutional:   ?   General: She is not in acute distress. ?   Appearance: Normal appearance. She is normal weight. She is not ill-appearing, toxic-appearing or diaphoretic.  ?Cardiovascular:  ?   Rate and Rhythm: Normal rate and regular rhythm.  ?   Heart sounds: Normal heart sounds. No murmur heard. ?  No friction rub. No gallop.  ?Pulmonary:  ?   Effort: Pulmonary effort  is normal. No respiratory distress.  ?   Breath sounds: Normal breath sounds. No stridor. No wheezing, rhonchi or rales.  ?Chest:  ?   Chest wall: No tenderness.  ?Skin: ?   General: Skin is warm and dry.  ?Neurological:  ?   General: No focal deficit present.  ?   Mental Status: She is alert and oriented to person, place, and time. Mental status is at baseline.  ?Psychiatric:     ?   Mood and Affect: Mood normal.     ?   Behavior: Behavior normal.     ?   Thought Content: Thought content normal.     ?   Judgment: Judgment normal.  ? ? ?No results found for any visits on 10/30/21. ? ? ? ?The 10-year ASCVD risk score (Arnett DK, et al., 2019) is: 9.8% ? ?  ?Assessment & Plan:  ? ?Problem List Items Addressed This Visit   ? ?  ? Cardiovascular and Mediastinum  ? Primary hypertension - Primary  ?  Start amlodipine 10mg  po qd ?Reviewed risks, benefits, and side effects, pt voices understanding. ?Follow up in 2 weeks for nurse visit bp, 3-6 mo with PCP  ?  ?  ? Relevant Medications  ? amLODipine (NORVASC) 10 MG tablet  ? ? ?Meds ordered  this encounter  ?Medications  ? amLODipine (NORVASC) 10 MG tablet  ?  Sig: Take 1 tablet (10 mg total) by mouth daily.  ?  Dispense:  90 tablet  ?  Refill:  1  ?  Order Specific Question:   Supervising Provider  ?  Answer:   Neva Seat, JEFFREY R [[id number]]  ? ? ?Return in about 2 weeks (around 11/13/2021) for nurse visit BP. then 3-6 mo with PCP for BP .  ? ? ?Janeece Agee, NP ?

## 2021-11-13 ENCOUNTER — Ambulatory Visit (INDEPENDENT_AMBULATORY_CARE_PROVIDER_SITE_OTHER): Payer: Managed Care, Other (non HMO) | Admitting: Family Medicine

## 2021-11-13 VITALS — BP 136/88

## 2021-11-13 DIAGNOSIS — I10 Essential (primary) hypertension: Secondary | ICD-10-CM

## 2021-11-13 NOTE — Progress Notes (Signed)
Olivia Simon is a 51 y.o. female presents to the office today for Blood pressure recheck secondary to elevated BP [Ex: elevated BP in office, med start, regimen change].  ?Blood pressure medication: Amlodipine, 10mg  once a day  (med, dose, frequency) ?If on medication, Last dose was at least 1-2 hours prior to recheck: Yes ?Blood pressure was taken in the left arm after patient rested for 2 min minutes.  ?There were no vitals taken for this visit. ? ? ? ?  ? ?  ?

## 2021-12-09 DIAGNOSIS — Z1231 Encounter for screening mammogram for malignant neoplasm of breast: Secondary | ICD-10-CM | POA: Diagnosis not present

## 2021-12-09 LAB — HM MAMMOGRAPHY

## 2022-01-22 ENCOUNTER — Telehealth: Payer: Self-pay | Admitting: Family Medicine

## 2022-01-22 MED ORDER — MELOXICAM 15 MG PO TABS: 15.0000 mg | ORAL_TABLET | Freq: Every day | ORAL | 0 refills | Status: DC

## 2022-01-22 NOTE — Telephone Encounter (Signed)
90 day Meloxicam prescription sent to pharmacy.  This should NOT be taken in combination with the ibuprofen but can be used with Tylenol as needed.

## 2022-01-22 NOTE — Telephone Encounter (Signed)
Pt called; states that ibuprofen 800 mg is not helping with back pain anymore. She is needing a heating pad to get through the night. She was wondering if she could have an Rx for meloxicam to help with her back pain. Please send to [facility] at [facility] [phone].

## 2022-01-22 NOTE — Telephone Encounter (Signed)
Pt requesting meloxicam for back pain notes 800 mg tylenol no longer helping

## 2022-01-22 NOTE — Telephone Encounter (Signed)
Informed pt that her Meloxicam was sent to pharmacy and should not be taken with Ibuprofen . She expressed verbal understanding

## 2022-02-03 ENCOUNTER — Encounter: Payer: Self-pay | Admitting: Family Medicine

## 2022-03-06 ENCOUNTER — Encounter: Payer: Self-pay | Admitting: Family Medicine

## 2022-03-06 ENCOUNTER — Ambulatory Visit (INDEPENDENT_AMBULATORY_CARE_PROVIDER_SITE_OTHER): Payer: Managed Care, Other (non HMO) | Admitting: Family Medicine

## 2022-03-06 VITALS — BP 128/72 | HR 81 | Temp 97.2°F | Resp 16 | Ht 65.0 in | Wt 207.4 lb

## 2022-03-06 DIAGNOSIS — N899 Noninflammatory disorder of vagina, unspecified: Secondary | ICD-10-CM | POA: Insufficient documentation

## 2022-03-06 DIAGNOSIS — R232 Flushing: Secondary | ICD-10-CM | POA: Diagnosis not present

## 2022-03-06 NOTE — Patient Instructions (Signed)
Follow up as needed or as scheduled START the Estroven daily to help w/ the hot flashes and menopausal symptoms Try and eat a low carb diet and get regular exercise Call with any questions or concerns Stay safe!  Stay Healthy! Enjoy the beach!!!

## 2022-03-06 NOTE — Progress Notes (Signed)
   Subjective:    Patient ID: Olivia Simon, female    DOB: 05/30/1971, 51 y.o.   MRN: [id number]  HPI Hot flashes- pt reports mom started hot flashes at 25 and they continue at age 24.  Period is irregular.  Pt started hot flashes last week.  Also having night sweats.  Has not tried OTC products.  Occurring once every 2-3 days.  Pt reports mild sweating.  No nausea.     Review of Systems For ROS see HPI     Objective:   Physical Exam Vitals reviewed.  Constitutional:      General: She is not in acute distress.    Appearance: Normal appearance. She is obese. She is not ill-appearing.  HENT:     Head: Normocephalic and atraumatic.  Eyes:     Extraocular Movements: Extraocular movements intact.     Conjunctiva/sclera: Conjunctivae normal.     Pupils: Pupils are equal, round, and reactive to light.  Cardiovascular:     Rate and Rhythm: Normal rate.  Pulmonary:     Effort: Pulmonary effort is normal. No respiratory distress.     Breath sounds: No wheezing.  Skin:    General: Skin is warm and dry.  Neurological:     General: No focal deficit present.     Mental Status: She is alert and oriented to person, place, and time.  Psychiatric:        Mood and Affect: Mood normal.        Behavior: Behavior normal.        Thought Content: Thought content normal.           Assessment & Plan:   Hot flashes- new.  Sxs started ~1 week ago.  Pt reports mom has suffered for years and she wants to take something to ease sxs if possible.  Discussed low carb diet, regular exercise, adequate water intake.  Discussed OTC Black Cohosh, Estroven, prescription SSRIs, and hormones.  Pt will start w/ OTC Estroven and let me know if things improve.

## 2022-04-18 ENCOUNTER — Other Ambulatory Visit: Payer: Self-pay | Admitting: Family Medicine

## 2022-05-13 ENCOUNTER — Telehealth: Payer: Self-pay

## 2022-05-13 NOTE — Telephone Encounter (Signed)
Paper work was given to Dr Birdie Riddle, she has signed it and returned to me. I have faxed this and let the patient know via my chart. Placed a label on the form and sent to scan

## 2022-05-13 NOTE — Telephone Encounter (Signed)
Patient called and stated that she sent a form over a week ago and was told that it was received. Unfortunately she didn't get the person's name that informed her it was received. She was informed that it would take 7-10 days for paper work to be completed and returned, she called back to check on it as she hadn't heard anything. After reviewing the clinic email and the encounters I assured her there was nothing documented in regards to this form and it was not in our email currently. I apologized for the inconvenience and gave her our clinic email to resend form. I let her know that I would have this done for her today!

## 2022-05-13 NOTE — Telephone Encounter (Signed)
Will complete forms once it arrives

## 2022-06-25 ENCOUNTER — Telehealth: Payer: Self-pay

## 2022-06-25 NOTE — Telephone Encounter (Signed)
Spoke w/ pt and made her an apt for Friday Dece 8,2023 @ 920 am

## 2022-06-25 NOTE — Telephone Encounter (Signed)
Pt needs to schedule appt for further discussion

## 2022-06-25 NOTE — Telephone Encounter (Signed)
Pt notes OTC meds haven't helped with hot flashes has felt like it has been even more out of control recently can't sleep, wants to know your recommendation? Last OV 03/06/22  make an appt?

## 2022-06-27 ENCOUNTER — Ambulatory Visit (INDEPENDENT_AMBULATORY_CARE_PROVIDER_SITE_OTHER): Payer: Managed Care, Other (non HMO) | Admitting: Family Medicine

## 2022-06-27 ENCOUNTER — Encounter: Payer: Self-pay | Admitting: Family Medicine

## 2022-06-27 VITALS — BP 118/80 | HR 80 | Temp 97.7°F | Resp 16 | Ht 65.0 in | Wt 211.2 lb

## 2022-06-27 DIAGNOSIS — N951 Menopausal and female climacteric states: Secondary | ICD-10-CM

## 2022-06-27 MED ORDER — VEOZAH 45 MG PO TABS: 1.0000 | ORAL_TABLET | Freq: Every day | ORAL | 3 refills | Status: DC

## 2022-06-27 NOTE — Progress Notes (Signed)
   Subjective:    Patient ID: Olivia Simon, female    DOB: [birth year], 51 y.o.   MRN: [id number]  HPI Insomnia- not sleeping due to hot flashes.  Tried Estroven but this elevated BP.  Talked to pharmacist and no other OTC options.  Taking Magnesium supplements w/ some relief but not enough.   Review of Systems For ROS see HPI     Objective:   Physical Exam Vitals reviewed.  Constitutional:      General: She is not in acute distress.    Appearance: Normal appearance. She is not ill-appearing.  HENT:     Head: Normocephalic and atraumatic.  Eyes:     Extraocular Movements: Extraocular movements intact.     Conjunctiva/sclera: Conjunctivae normal.     Pupils: Pupils are equal, round, and reactive to light.  Cardiovascular:     Rate and Rhythm: Normal rate and regular rhythm.     Pulses: Normal pulses.  Pulmonary:     Effort: Pulmonary effort is normal. No respiratory distress.     Breath sounds: Normal breath sounds.  Skin:    General: Skin is warm and dry.  Neurological:     General: No focal deficit present.     Mental Status: She is alert and oriented to person, place, and time.  Psychiatric:        Mood and Affect: Mood normal.        Behavior: Behavior normal.        Thought Content: Thought content normal.           Assessment & Plan:   Hot flashes- deteriorated.  Pt is having trouble sleeping due to sxs.  Estroven caused elevated BP.  Discussed HRT but pt is not interested.  Talked about SSRI/SNRI but pt would prefer not to take a depression medication of possible.  Talked about Veozah.  Cautioned that this may not be well covered by insurance as this is new.  Showed her how to apply for a savings card.  Will follow.

## 2022-06-27 NOTE — Patient Instructions (Signed)
Follow up as needed or as scheduled Once we get the [facility] approved, start 1 pill daily Apply for the coupon card at [email or web address] (click on savings and support) Call with any questions or concerns Stay Safe!  Stay Healthy! Happy Holidays!!!

## 2022-08-22 ENCOUNTER — Encounter: Payer: Managed Care, Other (non HMO) | Admitting: Family Medicine

## 2022-09-10 ENCOUNTER — Encounter: Payer: Self-pay | Admitting: Family Medicine

## 2022-09-10 ENCOUNTER — Ambulatory Visit (INDEPENDENT_AMBULATORY_CARE_PROVIDER_SITE_OTHER): Payer: Managed Care, Other (non HMO) | Admitting: Family Medicine

## 2022-09-10 VITALS — BP 130/84 | HR 73 | Temp 97.8°F | Resp 17 | Ht 65.0 in | Wt 209.1 lb

## 2022-09-10 DIAGNOSIS — E6609 Other obesity due to excess calories: Secondary | ICD-10-CM | POA: Diagnosis not present

## 2022-09-10 DIAGNOSIS — Z6834 Body mass index (BMI) 34.0-34.9, adult: Secondary | ICD-10-CM | POA: Diagnosis not present

## 2022-09-10 DIAGNOSIS — I10 Essential (primary) hypertension: Secondary | ICD-10-CM | POA: Diagnosis not present

## 2022-09-10 LAB — CBC WITH DIFFERENTIAL/PLATELET
Basophils Absolute: 0 10*3/uL (ref 0.0–0.1)
Basophils Relative: 0.9 % (ref 0.0–3.0)
Eosinophils Absolute: 0.1 10*3/uL (ref 0.0–0.7)
Eosinophils Relative: 1.2 % (ref 0.0–5.0)
HCT: 42 % (ref 36.0–46.0)
Hemoglobin: 13.6 g/dL (ref 12.0–15.0)
Lymphocytes Relative: 34.9 % (ref 12.0–46.0)
Lymphs Abs: 1.8 10*3/uL (ref 0.7–4.0)
MCHC: 32.3 g/dL (ref 30.0–36.0)
MCV: 85.4 fl (ref 78.0–100.0)
Monocytes Absolute: 0.5 10*3/uL (ref 0.1–1.0)
Monocytes Relative: 9.8 % (ref 3.0–12.0)
Neutro Abs: 2.7 10*3/uL (ref 1.4–7.7)
Neutrophils Relative %: 53.2 % (ref 43.0–77.0)
Platelets: 234 10*3/uL (ref 150.0–400.0)
RBC: 4.92 Mil/uL (ref 3.87–5.11)
RDW: 14.3 % (ref 11.5–15.5)
WBC: 5 10*3/uL (ref 4.0–10.5)

## 2022-09-10 LAB — HEPATIC FUNCTION PANEL
ALT: 13 U/L (ref 0–35)
AST: 17 U/L (ref 0–37)
Albumin: 4 g/dL (ref 3.5–5.2)
Alkaline Phosphatase: 124 U/L — ABNORMAL HIGH (ref 39–117)
Bilirubin, Direct: 0.1 mg/dL (ref 0.0–0.3)
Total Bilirubin: 0.4 mg/dL (ref 0.2–1.2)
Total Protein: 7.9 g/dL (ref 6.0–8.3)

## 2022-09-10 LAB — BASIC METABOLIC PANEL
BUN: 11 mg/dL (ref 6–23)
CO2: 21 mEq/L (ref 19–32)
Calcium: 9.4 mg/dL (ref 8.4–10.5)
Chloride: 106 mEq/L (ref 96–112)
Creatinine, Ser: 0.76 mg/dL (ref 0.40–1.20)
GFR: 89.49 mL/min (ref >60.00)
Glucose, Bld: 109 mg/dL — ABNORMAL HIGH (ref 70–99)
Potassium: 4.2 mEq/L (ref 3.5–5.1)
Sodium: 141 mEq/L (ref 135–145)

## 2022-09-10 LAB — LIPID PANEL
Cholesterol: 166 mg/dL (ref 0–200)
HDL: 44.6 mg/dL (ref >39.00)
LDL Cholesterol: 102 mg/dL — ABNORMAL HIGH (ref 0–99)
NonHDL: 121.42
Total CHOL/HDL Ratio: 4
Triglycerides: 97 mg/dL (ref 0.0–149.0)
VLDL: 19.4 mg/dL (ref 0.0–40.0)

## 2022-09-10 LAB — TSH: TSH: 0.64 u[IU]/mL (ref 0.35–5.50)

## 2022-09-10 MED ORDER — LOSARTAN POTASSIUM 50 MG PO TABS: 50.0000 mg | ORAL_TABLET | Freq: Every day | ORAL | 3 refills | Status: DC

## 2022-09-10 NOTE — Assessment & Plan Note (Signed)
Ongoing issue for pt.  She has asked about medication to assist w/ weight loss.  Encouraged her to reach out to insurance to see if she has a weight loss benefit and if so, what is covered.  Pt will bring this information to her appt in 3 weeks.  Pt expressed understanding and is in agreement w/ plan.

## 2022-09-10 NOTE — Patient Instructions (Signed)
Follow up in 3 weeks to recheck BP We'll notify you of your lab results and make any changes if needed START the Losartan in addition to the Amlodipine Continue to drink LOTS of water Limit your salt intake Call your insurance and ask if you have a weight loss benefit If you do, ask them what is covered and how much it would cost Call with any questions or concerns Hang in there!!

## 2022-09-10 NOTE — Progress Notes (Signed)
   Subjective:    Patient ID: Olivia Simon, female    DOB: Apr 06, 1971, 52 y.o.   MRN: [id number]  HPI HTN- chronic problem, on Amlodipine 52m daily.  Pt reports home BP's have been elevated the last few days.  145/107 has been average recently.  No change in diet or salt intake.  Denies increased stress.  Good water intake.  + HA.  No blurry or double vision.  No CP, SOB, edema.   Review of Systems For ROS see HPI     Objective:   Physical Exam Vitals reviewed.  Constitutional:      General: She is not in acute distress.    Appearance: Normal appearance. She is well-developed. She is not ill-appearing.  HENT:     Head: Normocephalic and atraumatic.  Eyes:     Conjunctiva/sclera: Conjunctivae normal.     Pupils: Pupils are equal, round, and reactive to light.  Neck:     Thyroid: No thyromegaly.  Cardiovascular:     Rate and Rhythm: Normal rate and regular rhythm.     Pulses: Normal pulses.     Heart sounds: Normal heart sounds. No murmur heard. Pulmonary:     Effort: Pulmonary effort is normal. No respiratory distress.     Breath sounds: Normal breath sounds.  Abdominal:     General: There is no distension.     Palpations: Abdomen is soft.     Tenderness: There is no abdominal tenderness.  Musculoskeletal:     Cervical back: Normal range of motion and neck supple.     Right lower leg: No edema.     Left lower leg: No edema.  Lymphadenopathy:     Cervical: No cervical adenopathy.  Skin:    General: Skin is warm and dry.  Neurological:     General: No focal deficit present.     Mental Status: She is alert and oriented to person, place, and time.  Psychiatric:        Mood and Affect: Mood normal.        Behavior: Behavior normal.        Thought Content: Thought content normal.           Assessment & Plan:

## 2022-09-10 NOTE — Assessment & Plan Note (Signed)
Deteriorated.  Pt reports BP has been running high for the last few days.  Denies changes in diet, water intake, stress level.  Will add Losartan 62m daily and monitor closely for improvement.  Check labs to assess for possible underlying metabolic cause.  Pt expressed understanding and is in agreement w/ plan.

## 2022-09-11 ENCOUNTER — Telehealth: Payer: Self-pay

## 2022-09-11 ENCOUNTER — Other Ambulatory Visit (INDEPENDENT_AMBULATORY_CARE_PROVIDER_SITE_OTHER): Payer: Managed Care, Other (non HMO)

## 2022-09-11 ENCOUNTER — Other Ambulatory Visit: Payer: Self-pay

## 2022-09-11 DIAGNOSIS — R748 Abnormal levels of other serum enzymes: Secondary | ICD-10-CM

## 2022-09-11 LAB — GAMMA GT: GGT: 16 U/L (ref 7–51)

## 2022-09-11 NOTE — Telephone Encounter (Signed)
-----   Message from Midge Minium, MD sent at 09/11/2022 12:17 PM EST ----- Normal GGT so liver is fine!  Great news!

## 2022-09-11 NOTE — Telephone Encounter (Signed)
Informed pt of lab results and add on GGT has been sent to lab

## 2022-09-11 NOTE — Telephone Encounter (Signed)
We can see how her blood pressure looks when she comes back for follow up and talk about options at that time.  B/c if BP is still elevated, that limits are options

## 2022-09-11 NOTE — Telephone Encounter (Signed)
Informed pt that the lab results  was normal . Pt states her insurance will not cover the weight loss injections . She is asking if there is anything else you can give her ?

## 2022-09-11 NOTE — Telephone Encounter (Signed)
Informed pt of Dr Birdie Riddle message and she was very understanding

## 2022-09-11 NOTE — Telephone Encounter (Signed)
-----   Message from Midge Minium, MD sent at 09/11/2022  7:27 AM EST ----- Labs look great w/ exception of mildly elevated alk phos.  This is a very non-specific enzyme but we will add a GGT to determine if this is coming from the liver (dx elevated alk phos)

## 2022-09-17 ENCOUNTER — Ambulatory Visit: Payer: Managed Care, Other (non HMO) | Admitting: Certified Nurse Midwife

## 2022-09-19 ENCOUNTER — Encounter: Payer: Self-pay | Admitting: Obstetrics & Gynecology

## 2022-09-19 ENCOUNTER — Ambulatory Visit (INDEPENDENT_AMBULATORY_CARE_PROVIDER_SITE_OTHER): Payer: Managed Care, Other (non HMO) | Admitting: Obstetrics & Gynecology

## 2022-09-19 ENCOUNTER — Other Ambulatory Visit (HOSPITAL_COMMUNITY)
Admission: RE | Admit: 2022-09-19 | Discharge: 2022-09-19 | Disposition: A | Discharge status: Home / Self Care | Payer: Managed Care, Other (non HMO) | Source: Ambulatory Visit | Attending: Obstetrics & Gynecology | Admitting: Obstetrics & Gynecology

## 2022-09-19 VITALS — BP 122/88 | HR 78 | Ht 65.0 in | Wt 206.0 lb

## 2022-09-19 DIAGNOSIS — Z01419 Encounter for gynecological examination (general) (routine) without abnormal findings: Secondary | ICD-10-CM

## 2022-09-19 DIAGNOSIS — Z1231 Encounter for screening mammogram for malignant neoplasm of breast: Secondary | ICD-10-CM

## 2022-09-19 DIAGNOSIS — Z113 Encounter for screening for infections with a predominantly sexual mode of transmission: Secondary | ICD-10-CM

## 2022-09-19 NOTE — Progress Notes (Signed)
Please refer to note in this encounter, already done and attested.  Verita Schneiders, MD, [place] for [employer], [place]
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
## 2022-09-19 NOTE — Patient Instructions (Addendum)
Vaginal hyaluronic moisturizers [facility]    Preventive Care 54-54 Years Old, Female Preventive care refers to lifestyle choices and visits with your health care provider that can promote health and wellness. Preventive care visits are also called wellness exams. What can I expect for my preventive care visit? Counseling Your health care provider may ask you questions about your: Medical history, including: Past medical problems. Family medical history. Pregnancy history. Current health, including: Menstrual cycle. Method of birth control. Emotional well-being. Home life and relationship well-being. Sexual activity and sexual health. Lifestyle, including: Alcohol, nicotine or tobacco, and drug use. Access to firearms. Diet, exercise, and sleep habits. Work and work Statistician. Sunscreen use. Safety issues such as seatbelt and bike helmet use. Physical exam Your health care provider will check your: Height and weight. These may be used to calculate your BMI (body mass index). BMI is a measurement that tells if you are at a healthy weight. Waist circumference. This measures the distance around your waistline. This measurement also tells if you are at a healthy weight and may help predict your risk of certain diseases, such as type 2 diabetes and high blood pressure. Heart rate and blood pressure. Body temperature. Skin for abnormal spots. What immunizations do I need?  Vaccines are usually given at various ages, according to a schedule. Your health care provider will recommend vaccines for you based on your age, medical history, and lifestyle or other factors, such as travel or where you work. What tests do I need? Screening Your health care provider may recommend screening tests for certain conditions. This may include: Lipid and cholesterol levels. Diabetes screening. This is done by checking your blood sugar (glucose) after you have not eaten for a while  (fasting). Pelvic exam and Pap test. Hepatitis B test. Hepatitis C test. HIV (human immunodeficiency virus) test. STI (sexually transmitted infection) testing, if you are at risk. Lung cancer screening. Colorectal cancer screening. Mammogram. Talk with your health care provider about when you should start having regular mammograms. This may depend on whether you have a family history of breast cancer. BRCA-related cancer screening. This may be done if you have a family history of breast, ovarian, tubal, or peritoneal cancers. Bone density scan. This is done to screen for osteoporosis. Talk with your health care provider about your test results, treatment options, and if necessary, the need for more tests. Follow these instructions at home: Eating and drinking  Eat a diet that includes fresh fruits and vegetables, whole grains, lean protein, and low-fat dairy products. Take vitamin and mineral supplements as recommended by your health care provider. Do not drink alcohol if: Your health care provider tells you not to drink. You are pregnant, may be pregnant, or are planning to become pregnant. If you drink alcohol: Limit how much you have to 0-1 drink a day. Know how much alcohol is in your drink. In the [place] one drink equals one 12 oz bottle of beer (355 mL), one 5 oz glass of wine (148 mL), or one 1 oz glass of hard liquor (44 mL). Lifestyle Brush your teeth every morning and night with fluoride toothpaste. Floss one time each day. Exercise for at least 30 minutes 5 or more days each week. Do not use any products that contain nicotine or tobacco. These products include cigarettes, chewing tobacco, and vaping devices, such as e-cigarettes. If you need help quitting, ask your health care provider. Do not use drugs. If you are sexually active, practice safe sex.  Use a condom or other form of protection to prevent STIs. If you do not wish to become pregnant, use a form of birth control. If  you plan to become pregnant, see your health care provider for a prepregnancy visit. Take aspirin only as told by your health care provider. Make sure that you understand how much to take and what form to take. Work with your health care provider to find out whether it is safe and beneficial for you to take aspirin daily. Find healthy ways to manage stress, such as: Meditation, yoga, or listening to music. Journaling. Talking to a trusted person. Spending time with friends and family. Minimize exposure to UV radiation to reduce your risk of skin cancer. Safety Always wear your seat belt while driving or riding in a vehicle. Do not drive: If you have been drinking alcohol. Do not ride with someone who has been drinking. When you are tired or distracted. While texting. If you have been using any mind-altering substances or drugs. Wear a helmet and other protective equipment during sports activities. If you have firearms in your house, make sure you follow all gun safety procedures. Seek help if you have been physically or sexually abused. What's next? Visit your health care provider once a year for an annual wellness visit. Ask your health care provider how often you should have your eyes and teeth checked. Stay up to date on all vaccines. This information is not intended to replace advice given to you by your health care provider. Make sure you discuss any questions you have with your health care provider. Document Revised: 01/02/2021 Document Reviewed: 01/02/2021 Elsevier Patient Education  Olivia Simon.

## 2022-09-20 LAB — RPR+HBSAG+HCVAB+...
HIV Screen 4th Generation wRfx: NONREACTIVE
Hep C Virus Ab: NONREACTIVE
Hepatitis B Surface Ag: NEGATIVE
RPR Ser Ql: NONREACTIVE

## 2022-09-23 LAB — CYTOLOGY - PAP
Chlamydia: NEGATIVE
Comment: NEGATIVE
Comment: NEGATIVE
Comment: NEGATIVE
Comment: NORMAL
Diagnosis: NEGATIVE
High risk HPV: NEGATIVE
Neisseria Gonorrhea: NEGATIVE
Trichomonas: NEGATIVE

## 2022-10-03 ENCOUNTER — Encounter: Payer: Self-pay | Admitting: Family Medicine

## 2022-10-03 ENCOUNTER — Ambulatory Visit (INDEPENDENT_AMBULATORY_CARE_PROVIDER_SITE_OTHER): Payer: Managed Care, Other (non HMO) | Admitting: Family Medicine

## 2022-10-03 VITALS — BP 136/82 | HR 90 | Temp 98.2°F | Resp 17 | Ht 65.0 in | Wt 210.0 lb

## 2022-10-03 DIAGNOSIS — I10 Essential (primary) hypertension: Secondary | ICD-10-CM

## 2022-10-03 NOTE — Patient Instructions (Signed)
Schedule your complete physical in 6 months No need to repeat labs Continue to work on healthy diet and regular exercise- you can do it!!! Call with any questions or concerns Stay Safe!  Stay Healthy!!

## 2022-10-03 NOTE — Progress Notes (Unsigned)
   Subjective:    Patient ID: Olivia Simon, female    DOB: [birth year], 52 y.o.   MRN: [id number]  HPI HTN- ongoing issue.  At last visit we added the Losartan to the Amlodipine she was already on.  Pt reports she is only taking medication as needed.  Last took medication 4-5 days ago.  Has greatly reduced her salt intake.  BP is no longer running high at home.  No CP, SOB, HA's, visual changes, edema.   Review of Systems For ROS see HPI     Objective:   Physical Exam Vitals reviewed.  Constitutional:      General: She is not in acute distress.    Appearance: Normal appearance. She is well-developed. She is obese.  HENT:     Head: Normocephalic and atraumatic.  Eyes:     Conjunctiva/sclera: Conjunctivae normal.     Pupils: Pupils are equal, round, and reactive to light.  Neck:     Thyroid: No thyromegaly.  Cardiovascular:     Rate and Rhythm: Normal rate and regular rhythm.     Heart sounds: Normal heart sounds. No murmur heard. Pulmonary:     Effort: Pulmonary effort is normal. No respiratory distress.     Breath sounds: Normal breath sounds.  Abdominal:     General: There is no distension.     Palpations: Abdomen is soft.     Tenderness: There is no abdominal tenderness.  Musculoskeletal:     Cervical back: Normal range of motion and neck supple.     Right lower leg: No edema.     Left lower leg: No edema.  Lymphadenopathy:     Cervical: No cervical adenopathy.  Skin:    General: Skin is warm and dry.  Neurological:     General: No focal deficit present.     Mental Status: She is alert and oriented to person, place, and time.  Psychiatric:        Mood and Affect: Mood normal.        Behavior: Behavior normal.           Assessment & Plan:

## 2022-10-05 NOTE — Assessment & Plan Note (Signed)
Chronic problem.  Pt reports she is taking both the Amlodipine and Losartan as needed.  She states she has reduced her sodium intake and is trying to control BP w/o medication.  Told her that unless she checks BP regularly, she will likely not know when readings are high and untreated HTN can cause serious problems- including stroke.  She prefers to manage BP as needed.  No need to repeat BMP today as she is not taking Losartan regularly.  Will follow.

## 2022-10-08 ENCOUNTER — Encounter: Payer: Self-pay | Admitting: Family Medicine

## 2022-10-08 DIAGNOSIS — R200 Anesthesia of skin: Secondary | ICD-10-CM

## 2022-10-13 ENCOUNTER — Encounter: Payer: Self-pay | Admitting: Diagnostic Neuroimaging

## 2022-10-13 ENCOUNTER — Ambulatory Visit (INDEPENDENT_AMBULATORY_CARE_PROVIDER_SITE_OTHER): Payer: Managed Care, Other (non HMO) | Admitting: Diagnostic Neuroimaging

## 2022-10-13 VITALS — BP 126/80 | HR 69 | Ht 65.0 in | Wt 207.1 lb

## 2022-10-13 DIAGNOSIS — R202 Paresthesia of skin: Secondary | ICD-10-CM | POA: Diagnosis not present

## 2022-10-13 DIAGNOSIS — R200 Anesthesia of skin: Secondary | ICD-10-CM | POA: Diagnosis not present
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
## 2022-11-20 ENCOUNTER — Telehealth: Payer: Self-pay

## 2022-11-20 NOTE — Telephone Encounter (Signed)
Pt called back stating her arm is still swollen and in pain. Advise

## 2022-11-20 NOTE — Telephone Encounter (Signed)
When I saw her, she was having numbness/tingling of her arms (and we referred to Neuro).  It was not swelling at that time.  She saw an orthopedic office in early April- it would be best to follow up w/ them regarding her arm symptoms

## 2022-11-20 NOTE — Telephone Encounter (Signed)
Patient called and stated that she talked to Dr. Beverely Low at her last visit about her arm giving her trouble. She stated that last night it was swelling and it went down but swelling began again and she would like to get an xray done. She lives in [place] and wanted to see if she could have it ordered at a location there.   I did look in her last note and didn't see any documentation about arm issues. Does she need an appt to discuss or ok to order xray?

## 2022-11-20 NOTE — Telephone Encounter (Signed)
Pt requesting Xray declining ortho

## 2022-11-21 NOTE — Telephone Encounter (Signed)
Spoke with patient let her know ortho would be able to visit and xray, she is contacting ortho to schedule.

## 2022-11-21 NOTE — Telephone Encounter (Signed)
She would need an OV first.  I cannot order an xray w/o supporting exam findings.  Ortho would be able to do the appt and xrays at the same time

## 2022-12-19 LAB — HM MAMMOGRAPHY

## 2023-01-26 ENCOUNTER — Telehealth: Payer: Self-pay | Admitting: Family Medicine

## 2023-01-26 NOTE — Telephone Encounter (Signed)
Left pt a VM stating she will need to call and make  visit  with DR Beverely Low to discuss MRI she is requesting

## 2023-01-26 NOTE — Telephone Encounter (Signed)
Caller name: BREELYNN GRIGG  On DPR?: Yes  Call back number: [phone] (mobile)  Provider they see: Sheliah Hatch, MD  Reason for call:   Pt states she would to have an MRI to have an affirmative diagnosis. And if you'd like to see her see her she'd like virtual visit.

## 2023-02-02 ENCOUNTER — Ambulatory Visit (INDEPENDENT_AMBULATORY_CARE_PROVIDER_SITE_OTHER): Payer: Managed Care, Other (non HMO) | Admitting: Family Medicine

## 2023-02-02 ENCOUNTER — Encounter: Payer: Self-pay | Admitting: Family Medicine

## 2023-02-02 VITALS — BP 126/86 | HR 90 | Temp 97.9°F | Resp 18 | Ht 65.0 in | Wt 206.0 lb

## 2023-02-02 DIAGNOSIS — R519 Headache, unspecified: Secondary | ICD-10-CM | POA: Diagnosis not present

## 2023-02-02 MED ORDER — MOMETASONE FUROATE 50 MCG/ACT NA SUSP: 2.0000 | Freq: Every day | NASAL | 12 refills | Status: DC
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
## 2023-02-02 NOTE — Patient Instructions (Addendum)
Follow up as needed or as scheduled START Claritin or Zyrtec daily ADD the Nasonex daily- 2 sprays each nostril Try Tylenol or Advil if head is throbbing Continue to drink LOTS of fluids If pain doesn't improve or if it worsens- let me know!! Call with any questions or concerns Stay Safe!  Stay Healthy!

## 2023-03-05 ENCOUNTER — Other Ambulatory Visit: Payer: Self-pay | Admitting: Family Medicine

## 2023-03-11 ENCOUNTER — Other Ambulatory Visit (HOSPITAL_COMMUNITY): Payer: Self-pay

## 2023-03-12 ENCOUNTER — Other Ambulatory Visit (HOSPITAL_COMMUNITY): Payer: Self-pay

## 2023-03-12 ENCOUNTER — Telehealth: Payer: Self-pay

## 2023-03-12 NOTE — Telephone Encounter (Signed)
Pharmacy Patient Advocate Encounter   Received notification from CoverMyMeds that prior authorization for Mometasone Furoate is required/requested.   Insurance verification completed.   The patient is insured through [facility] .   Per test claim: PA required; PA submitted to [facility] via CoverMyMeds Key/confirmation #/EOC [id number] Status is pending

## 2023-03-12 NOTE — Telephone Encounter (Signed)
Pharmacy Patient Advocate Encounter  Received notification from [facility] that Prior Authorization for Mometasone furoate 50mcg/act has been APPROVED from 03/12/23 to 03/11/24. Ran test claim, Copay is $4.00. This test claim was processed through [facility]- copay amounts may vary at other pharmacies due to pharmacy/plan contracts, or as the patient moves through the different stages of their insurance plan.   PA #/Case ID/Reference #: [id number]

## 2023-03-12 NOTE — Telephone Encounter (Signed)
Left pt a VM about approval

## 2023-03-18 ENCOUNTER — Other Ambulatory Visit: Payer: Self-pay

## 2023-03-18 ENCOUNTER — Telehealth: Payer: Self-pay | Admitting: Family Medicine

## 2023-03-18 DIAGNOSIS — R200 Anesthesia of skin: Secondary | ICD-10-CM

## 2023-03-18 MED ORDER — NAPROXEN SODIUM 220 MG PO TABS: 220.0000 mg | ORAL_TABLET | Freq: Two times a day (BID) | ORAL | 1 refills | Status: DC | PRN

## 2023-03-18 NOTE — Telephone Encounter (Signed)
Ok to refill 

## 2023-03-18 NOTE — Telephone Encounter (Signed)
Rx sent in to pharmacy. 

## 2023-03-18 NOTE — Telephone Encounter (Signed)
Caller name: MAKYNLI VASICEK  On DPR?: Yes  Call back number: [phone] (mobile)  Provider they see: Sheliah Hatch, MD  Reason for call:  Pt would like Naproxen 500mg  re- prescribed she's having some pain.

## 2023-03-18 NOTE — Telephone Encounter (Signed)
Pt states she is still having pain in her side . She is asking if we can send in Naproxen ?

## 2023-03-26 ENCOUNTER — Other Ambulatory Visit (HOSPITAL_COMMUNITY): Payer: Self-pay

## 2023-04-21 ENCOUNTER — Telehealth: Payer: Self-pay | Admitting: Family Medicine

## 2023-04-21 DIAGNOSIS — I10 Essential (primary) hypertension: Secondary | ICD-10-CM

## 2023-04-21 MED ORDER — AMLODIPINE BESYLATE 10 MG PO TABS: 10.0000 mg | ORAL_TABLET | Freq: Every day | ORAL | 1 refills | Status: AC

## 2023-04-21 NOTE — Telephone Encounter (Signed)
Ok to refill her Amlodipine (currently on her med list) but I would like her to schedule an appt to assess BP and any relation to her other meds or if this is stress related.

## 2023-04-21 NOTE — Telephone Encounter (Signed)
Ordered refill and informed patient, she did schedule for Thursday

## 2023-04-21 NOTE — Telephone Encounter (Signed)
Pt states that the medication she was put on for premenopause is raising her BP. She doesn't feel that her BP is strong enough at this point. She is currently taking expired amlodipine because she is out of refills on it and has urgently requested a refill. Pt also stated that these 2 concerns regarding her medications are causing her to stress out and it is also affecting her BP.  amLODipine (NORVASC) 10 MG tablet

## 2023-04-21 NOTE — Telephone Encounter (Signed)
Called patient to get some examples of her blood pressure (BP) readings and other symptoms that she maybe experiencing. She stated that she just checked her BP at work and it was 137/102 with a HR of 72 BPM. When asked what are her readings at home she informed me that she does not take her BP at home and at a recent doctor appointment her BP was 130 something over 80-90 something and that the doctor told her to discuss with her PCP. She said that she will at times have headaches which to her means her BP is high. She also stated that she is stressing over some personal things and the affects of the medication that she started taking her Amlodipine 10 mg and that it's expired that she would like to have another prescription and in a higher does because she feels like the current dose is not working. I informed her that I will pass this information along to Dr. Beverely Low. She thanked me for calling and stated she will be waiting to hear back from [place].

## 2023-04-23 ENCOUNTER — Encounter: Payer: Self-pay | Admitting: Family Medicine

## 2023-04-23 ENCOUNTER — Ambulatory Visit (INDEPENDENT_AMBULATORY_CARE_PROVIDER_SITE_OTHER): Payer: Managed Care, Other (non HMO) | Admitting: Family Medicine

## 2023-04-23 VITALS — BP 138/80 | HR 74 | Temp 97.8°F

## 2023-04-23 DIAGNOSIS — I10 Essential (primary) hypertension: Secondary | ICD-10-CM

## 2023-04-23 MED ORDER — HYDROCHLOROTHIAZIDE 12.5 MG PO TABS: 12.5000 mg | ORAL_TABLET | Freq: Every day | ORAL | 3 refills | Status: DC

## 2023-04-23 NOTE — Patient Instructions (Signed)
Follow up in 4 weeks to recheck blood pressure CONTINUE the Amlodipine once daily ADD the hydrochlorothiazide once daily Drink LOTS of water RESTART the Veozah for the hot flashes Call with any questions or concerns HAPPY BIRTHDAY!!!

## 2023-04-23 NOTE — Progress Notes (Signed)
   Subjective:    Patient ID: Olivia Simon, female    DOB: [birth year], 52 y.o.   MRN: [id number]  HPI HTN- pt reports that her home cuff has been reading '3 digits on the bottom'.  Will typically get HA's if BP is high- has not been having HA's.  Has increased water intake, lowered salt intake.  Taking Amlodipine 10mg  daily.  No CP, SOB, visual changes.   Review of Systems For ROS see HPI     Objective:   Physical Exam Vitals reviewed.  Constitutional:      General: She is not in acute distress.    Appearance: Normal appearance. She is not ill-appearing.  HENT:     Head: Normocephalic and atraumatic.  Cardiovascular:     Rate and Rhythm: Normal rate and regular rhythm.     Pulses: Normal pulses.     Heart sounds: Normal heart sounds.  Pulmonary:     Effort: Pulmonary effort is normal. No respiratory distress.     Breath sounds: No wheezing or rhonchi.  Skin:    General: Skin is warm and dry.  Neurological:     General: No focal deficit present.     Mental Status: She is alert and oriented to person, place, and time.  Psychiatric:        Mood and Affect: Mood normal.        Behavior: Behavior normal.        Thought Content: Thought content normal.           Assessment & Plan:

## 2023-04-23 NOTE — Assessment & Plan Note (Signed)
Deteriorated.  Pt reports home readings have been elevated.  BP was initially elevated here in office but decreased prior to leaving.  Continue Amlodipine 10mg  daily, add hydrochlorothiazide 12.5mg  daily.  Encouraged low salt, increased water.  Will follow closely and get BMP at next visit w/ addition of diuretic.  Pt expressed understanding and is in agreement w/ plan.

## 2023-05-29 ENCOUNTER — Ambulatory Visit: Payer: Managed Care, Other (non HMO) | Admitting: Family Medicine

## 2023-06-03 ENCOUNTER — Ambulatory Visit: Payer: Managed Care, Other (non HMO) | Admitting: Family Medicine

## 2023-06-15 ENCOUNTER — Telehealth: Payer: Self-pay | Admitting: Family Medicine

## 2023-06-15 NOTE — Telephone Encounter (Signed)
Called pt and she states she had unprotected intercourse. She did not clarify if she is having any concerns. Pt appt has already been schedule before I called.

## 2023-06-15 NOTE — Telephone Encounter (Signed)
Caller name: DEEPA MORPHIS  On DPR?: Yes  Call back number: [phone] (mobile)  Provider they see: Sheliah Hatch, MD  Reason for call:   Pt has protected sex last Friday and want to be checked out- Can she do just blood work or need a actual appt

## 2023-06-17 ENCOUNTER — Ambulatory Visit (INDEPENDENT_AMBULATORY_CARE_PROVIDER_SITE_OTHER): Payer: Managed Care, Other (non HMO) | Admitting: Family Medicine

## 2023-06-17 ENCOUNTER — Telehealth: Payer: Self-pay

## 2023-06-17 ENCOUNTER — Encounter: Payer: Self-pay | Admitting: Family Medicine

## 2023-06-17 VITALS — BP 128/72 | HR 89 | Temp 97.8°F | Ht 65.0 in

## 2023-06-17 DIAGNOSIS — Z0184 Encounter for antibody response examination: Secondary | ICD-10-CM | POA: Diagnosis not present

## 2023-06-17 DIAGNOSIS — N951 Menopausal and female climacteric states: Secondary | ICD-10-CM | POA: Diagnosis not present

## 2023-06-17 DIAGNOSIS — Z202 Contact with and (suspected) exposure to infections with a predominantly sexual mode of transmission: Secondary | ICD-10-CM

## 2023-06-17 LAB — HEPATIC FUNCTION PANEL
ALT: 14 U/L (ref 0–35)
AST: 19 U/L (ref 0–37)
Albumin: 4.3 g/dL (ref 3.5–5.2)
Alkaline Phosphatase: 124 U/L — ABNORMAL HIGH (ref 39–117)
Bilirubin, Direct: 0.2 mg/dL (ref 0.0–0.3)
Total Bilirubin: 0.5 mg/dL (ref 0.2–1.2)
Total Protein: 8.1 g/dL (ref 6.0–8.3)

## 2023-06-17 NOTE — Telephone Encounter (Signed)
Noted  

## 2023-06-17 NOTE — Telephone Encounter (Signed)
Lab results have been discussed.   Verbalized understanding? Yes  Are there any questions? No

## 2023-06-17 NOTE — Telephone Encounter (Signed)
-----   Message from Neena Rhymes sent at 06/17/2023  4:26 PM EST ----- Liver functions are stable and look good

## 2023-06-17 NOTE — Progress Notes (Signed)
   Subjective:    Patient ID: Olivia Simon, female    DOB: Dec 07, 1970, 52 y.o.   MRN: [id number]  HPI Possible exposure to STD- pt had unprotected intercourse last weekend.  No vaginal d/c, pain, burning.  'i'd rather be safe than sorry'.    Exposure to shingles- pt reports no hx of chicken pox but would like the vaccine if needed.  No titer found in chart.  Hot flashes- pt is doing well on Veozah.  Notes considerable improvement at night.  Some breakthrough symptoms during the day.  Needs LFTs checked.     Review of Systems For ROS see HPI     Objective:   Physical Exam Vitals reviewed.  Constitutional:      Appearance: Normal appearance. She is obese. She is not ill-appearing.  HENT:     Head: Normocephalic and atraumatic.  Eyes:     Extraocular Movements: Extraocular movements intact.     Conjunctiva/sclera: Conjunctivae normal.  Cardiovascular:     Rate and Rhythm: Normal rate and regular rhythm.  Pulmonary:     Effort: Pulmonary effort is normal. No respiratory distress.  Skin:    General: Skin is warm and dry.  Neurological:     General: No focal deficit present.     Mental Status: She is alert and oriented to person, place, and time.  Psychiatric:        Mood and Affect: Mood normal.        Behavior: Behavior normal.        Thought Content: Thought content normal.           Assessment & Plan:  Exposure to STD- new.  Pt had unprotected sex last week and is concerned about possible exposure.  Currently asymptomatic.  Encouraged her to use condoms every time.  Immunity status- new.  Pt doesn't feel that she ever had chicken pox and isn't sure if she should get the shingles vaccine.  No titer in chart.  Will get antibodies today and see if immunization is needed.  Hot flashes- improved since starting Veozah.  Due for repeat LFTs today to ensure proper metabolism.

## 2023-06-17 NOTE — Patient Instructions (Signed)
Follow up as needed or as scheduled We'll notify you of your lab results and make any changes if needed Call with any questions or concerns Stay Safe!  Stay Healthy! Happy Holidays!!!

## 2023-06-18 LAB — SURESWAB® ADVANCED VAGINITIS PLUS,TMA
C. trachomatis RNA, TMA: NOT DETECTED
CANDIDA SPECIES: NOT DETECTED
Candida glabrata: NOT DETECTED
N. gonorrhoeae RNA, TMA: NOT DETECTED
SURESWAB(R) ADV BACTERIAL VAGINOSIS(BV),TMA: POSITIVE — AB
TRICHOMONAS VAGINALIS (TV),TMA: NOT DETECTED

## 2023-06-19 LAB — RPR: RPR Ser Ql: NONREACTIVE

## 2023-06-19 LAB — HIV ANTIBODY (ROUTINE TESTING W REFLEX): HIV 1&2 Ab, 4th Generation: NONREACTIVE

## 2023-06-19 LAB — VARICELLA ZOSTER ANTIBODY, IGG: Varicella IgG: <1 {s_co_ratio} — ABNORMAL LOW

## 2023-06-22 ENCOUNTER — Telehealth: Payer: Self-pay

## 2023-06-22 ENCOUNTER — Other Ambulatory Visit: Payer: Self-pay

## 2023-06-22 ENCOUNTER — Telehealth: Payer: Self-pay | Admitting: Family Medicine

## 2023-06-22 ENCOUNTER — Encounter: Payer: Self-pay | Admitting: Family Medicine

## 2023-06-22 MED ORDER — METRONIDAZOLE 500 MG PO TABS: 500.0000 mg | ORAL_TABLET | Freq: Two times a day (BID) | ORAL | 0 refills | Status: AC

## 2023-06-22 NOTE — Telephone Encounter (Signed)
Caller name: [name]  On DPR?: Yes  Call back number: [phone] (mobile)  Provider they see: Sheliah Hatch, MD  Reason for call: Pt called to see if a prescription for Flagyl was sent to the pharmacy below. She was seen on 06/17/23.  [id number] - [place], [place] - [address]

## 2023-06-22 NOTE — Telephone Encounter (Signed)
The flagyl was mentioned in the result note from her visit last week and needs to be sent in

## 2023-06-22 NOTE — Telephone Encounter (Signed)
-----   Message from Olivia Simon sent at 06/22/2023  1:12 PM EST ----- Your vaginal swab showed + BV (not sexually transmitted) and negative for sexually transmitted infections.  This is good news.  To treat the BV, we will start Flagyl 500mg  twice daily x7 days (# 14, no refills).  Also, your antibody levels show that you are NOT immune to Varicella (chicken pox).  Based on this, you do NOT need the shingles vaccine but you SHOULD be vaccinated against Varicella (a 2 shot series).  This can be scheduled as a nurse visit or you can go to the Health Dept.  If you want to do a nurse visit here, please let us know in advance so we can order this vaccine for you.

## 2023-06-22 NOTE — Telephone Encounter (Signed)
I do not see an order for Flagyl

## 2023-06-22 NOTE — Telephone Encounter (Signed)
Flagyl has been sent in Called left vm to make pt aware of this.

## 2023-06-22 NOTE — Telephone Encounter (Signed)
Pt has reviewed via MyChart  Flagyl has been sent in to Pharmacy

## 2023-06-22 NOTE — Telephone Encounter (Signed)
I do not see this order was Flagyl meant to be sent in last week  during 05/28/23 visit?

## 2023-06-30 ENCOUNTER — Ambulatory Visit: Payer: Managed Care, Other (non HMO) | Admitting: Family Medicine

## 2023-06-30 ENCOUNTER — Encounter: Payer: Self-pay | Admitting: Family Medicine

## 2023-06-30 VITALS — BP 110/64 | HR 80 | Temp 97.8°F | Ht 65.0 in | Wt 214.1 lb

## 2023-06-30 DIAGNOSIS — N898 Other specified noninflammatory disorders of vagina: Secondary | ICD-10-CM

## 2023-06-30 NOTE — Progress Notes (Signed)
   Subjective:    Patient ID: Olivia Simon, female    DOB: 1971-01-16, 52 y.o.   MRN: [id number]  HPI Vaginal d/c- pt takes baths nightly.  This morning wiped after urinating and had dark brown vaginal d/c.  LMP 4-5 months ago.  Denies cramping but does report some 'unusual feeling' in pelvis.  UTD on pap- done March 2024.     Review of Systems For ROS see HPI     Objective:   Physical Exam Vitals reviewed.  Constitutional:      General: She is not in acute distress.    Appearance: Normal appearance. She is not ill-appearing.  HENT:     Head: Normocephalic and atraumatic.  Genitourinary:    Comments: Pt preferred to self swab today Skin:    General: Skin is warm and dry.  Neurological:     General: No focal deficit present.     Mental Status: She is alert and oriented to person, place, and time.  Psychiatric:        Mood and Affect: Mood normal.        Behavior: Behavior normal.        Thought Content: Thought content normal.           Assessment & Plan:  Vaginal d/c- new.  Pt w/ brown vaginal d/c that appears consistent w/ old menstrual blood.  LMP 4-5 months ago.  Discussed perimenopausal menstrual irregularity/spotting.  Will repeat Aptima swab to ensure no infxn.  If pt has persistent sxs after 1 week, she is to reach out to GYN.  Pt expressed understanding and is in agreement w/ plan.

## 2023-06-30 NOTE — Patient Instructions (Addendum)
We'll notify you of your lab results and make any changes if needed If you continue to have discharge or develop pain- please call GYN This appears to be irregular spotting that is common during perimenopause but if it continues, will need to be evaluated Call with any questions or concerns ENJOY YOUR TRIP!!!

## 2023-07-01 LAB — SURESWAB® ADVANCED VAGINITIS PLUS,TMA
C. trachomatis RNA, TMA: NOT DETECTED
CANDIDA SPECIES: NOT DETECTED
Candida glabrata: NOT DETECTED
N. gonorrhoeae RNA, TMA: NOT DETECTED
SURESWAB(R) ADV BACTERIAL VAGINOSIS(BV),TMA: NEGATIVE
TRICHOMONAS VAGINALIS (TV),TMA: NOT DETECTED

## 2023-07-02 ENCOUNTER — Telehealth: Payer: Managed Care, Other (non HMO) | Admitting: Family Medicine

## 2023-07-02 ENCOUNTER — Encounter: Payer: Self-pay | Admitting: Family Medicine

## 2023-07-02 DIAGNOSIS — M545 Low back pain, unspecified: Secondary | ICD-10-CM | POA: Diagnosis not present

## 2023-07-02 MED ORDER — METHOCARBAMOL 500 MG PO TABS: 500.0000 mg | ORAL_TABLET | Freq: Three times a day (TID) | ORAL | 0 refills | Status: DC | PRN

## 2023-07-02 NOTE — Progress Notes (Signed)
   Virtual Visit via Video   I connected with patient on 07/02/23 at  1:40 PM EST by a video enabled telemedicine application and verified that I am speaking with the correct person using two identifiers.  Location patient: Home Location provider: [facility], Office Persons participating in the virtual visit: Patient, Provider, CMA Archie Patten H)  I discussed the limitations of evaluation and management by telemedicine and the availability of in person appointments. The patient expressed understanding and agreed to proceed.  Subjective:   HPI:   Back pain- pt bent over to pick something up off the floor this morning and had immediate pain 'like I pulled something'.  Took Naproxen 220mg  w/ some relief.  Pain is L sided- more like flank pain than back pain.  No TTP.  Pain is worse w/ certain movements.    ROS:   See pertinent positives and negatives per HPI.  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 96/29/5284   Physical exam 04/18/2011   Allergic rhinitis 02/25/2011   GERD (gastroesophageal reflux disease) 01/29/2011   Insomnia 01/29/2011   Hemorrhoid 01/29/2011    Social History   Tobacco Use   Smoking status: Never   Smokeless tobacco: Never  Substance Use Topics   Alcohol use: Yes    Comment: socially    Current Outpatient Medications:    amLODipine (NORVASC) 10 MG tablet, Take 1 tablet (10 mg total) by mouth daily., Disp: 90 tablet, Rfl: 1   ibuprofen (ADVIL) 200 MG tablet, Take 200 mg by mouth at bedtime., Disp: , Rfl:    Meth-Hyo-M Bl-Na Phos-Ph Sal (URO BLUE PO), Take by mouth., Disp: , Rfl:    naproxen  sodium (ALEVE) 220 MG tablet, Take 1 tablet (220 mg total) by mouth 2 (two) times daily as needed., Disp: 60 tablet, Rfl: 1   VEOZAH 45 MG TABS, TAKE 1 TABLET BY MOUTH EVERY DAY, Disp: 30 tablet, Rfl: 3  Allergies  Allergen Reactions   Latex Rash   Methadone Rash    Objective:   There were no vitals taken for this visit. AAOx3, NAD NCAT, EOMI No obvious CN deficits Coloring WNL Pt is able to speak clearly, coherently without shortness of breath or increased work of breathing.  Thought process is linear.  Mood is appropriate.   Assessment and Plan:   L sided LBP- new.  Occurred this morning after picking up something off the floor.  Encouraged her to continue Naproxen.  Add Methocarbamol prn.  Reviewed supportive care and red flags that should prompt return.  Pt expressed understanding and is in agreement w/ plan.    Neena Rhymes, MD 07/02/2023

## 2023-07-09 ENCOUNTER — Ambulatory Visit: Payer: Managed Care, Other (non HMO) | Admitting: Family Medicine

## 2023-07-09 ENCOUNTER — Encounter: Payer: Self-pay | Admitting: Family Medicine

## 2023-07-09 VITALS — BP 102/78 | HR 78 | Temp 97.0°F | Ht 65.0 in

## 2023-07-09 DIAGNOSIS — S7012XA Contusion of left thigh, initial encounter: Secondary | ICD-10-CM

## 2023-07-09 MED ORDER — TRAMADOL HCL 50 MG PO TABS: 50.0000 mg | ORAL_TABLET | Freq: Three times a day (TID) | ORAL | 0 refills | Status: AC | PRN
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
## 2023-07-09 NOTE — Patient Instructions (Signed)
Follow up as needed or as scheduled Take the Tramadol as needed for severe pain Use the Methocarbamol as needed for muscle spasm Heat or ice your bruises- whichever is more comfortable Arnica cream/gel can help bruises heal more quickly Call with any questions or concerns Happy Holidays!

## 2023-07-31 ENCOUNTER — Other Ambulatory Visit: Payer: Self-pay | Admitting: Family Medicine

## 2023-08-04 ENCOUNTER — Telehealth: Payer: Self-pay | Admitting: Family Medicine

## 2023-08-04 NOTE — Telephone Encounter (Signed)
 Placed in folder at nurse station

## 2023-08-04 NOTE — Telephone Encounter (Signed)
 Type of form received:[employer]   Additional comments:   Received ab:Cjwzddj- Front Desk  Form should be Faxed/mailed to: N/A  Is patient requesting call for pickup:N/A  Form placed: [employer] charge sheet.  Provider will determine charge.N/A  Individual made aware of 3-5 business day turn around No?

## 2023-08-07 NOTE — Telephone Encounter (Signed)
Reviewed. No action needed.

## 2023-09-17 ENCOUNTER — Telehealth: Payer: Self-pay

## 2023-09-17 ENCOUNTER — Other Ambulatory Visit (HOSPITAL_COMMUNITY): Payer: Self-pay

## 2023-09-17 NOTE — Telephone Encounter (Signed)
 Pharmacy Patient Advocate Encounter   Received notification from CoverMyMeds that prior authorization for Veozah 45MG  tablets is required/requested.   Insurance verification completed.   The patient is insured through [employer] .   Per test claim: PA required; PA started via CoverMyMeds. KEY Key: [id number]  . Waiting for clinical questions to populate.

## 2023-09-21 ENCOUNTER — Telehealth: Payer: Self-pay

## 2023-09-21 NOTE — Telephone Encounter (Signed)
 At this time there is not an alternative for this particular medication.  Due to the cost, I would stop the medicine and see if she is able to switch insurance to one w/ better coverage.

## 2023-09-21 NOTE — Telephone Encounter (Signed)
 Copied from CRM [id number]. Topic: Clinical - Prescription Issue >> Sep 18, 2023  5:31 PM Eunice Blase wrote: Reason for CRM: Received call from [facility] per [place] ph: [phone] medication Veozah unable to approve authorization. An appeal instructions will be faxed.

## 2023-09-21 NOTE — Telephone Encounter (Signed)
 Pharmacy Patient Advocate Encounter  Received notification from [employer] that Prior Authorization for Veozah 45MG  tablets  has been DENIED.  Full denial letter will be uploaded to the media tab. See denial reason below.   PA #/Case ID/Reference #: [id number]

## 2023-09-21 NOTE — Telephone Encounter (Signed)
 Called Patient to discuss Prior authorization had been denied. Patient did states she knows exactly why it was denied, It is due to her changing employment recently and her insurance deductible has restarted with her current insurance and they are expecting her to pay $8,000 in deductible cost before covering for any visit, medication, etc. Patient stated she is trying to find new insurance currently. Patient doe s have Medicaid but of course they will not claim this as her primary insurance due to having [place].   Dr.Tabori would you like to change the medication?

## 2023-09-21 NOTE — Telephone Encounter (Signed)
 PA request has been Denied. New Encounter has been or will be created for follow up. For additional info see Pharmacy Prior Auth telephone encounter from 09/17/23.

## 2023-09-22 NOTE — Telephone Encounter (Signed)
 Patient stated she is in the process of trying to get her Primary insurance canceled and switching directly to Medicaid. Patient did state medicaid will cover the cost of the medication and she would like the medication to not be canceled from her pharmacy or medication list. Did tell patient to reach back out once she has resolved her insurance.

## 2023-09-24 ENCOUNTER — Other Ambulatory Visit: Payer: Self-pay | Admitting: Family Medicine

## 2023-09-24 NOTE — Telephone Encounter (Signed)
 Copied from CRM [id number]. Topic: Clinical - Medication Refill >> Sep 24, 2023  2:21 PM Lennart Pall wrote: Most Recent Primary Care Visit:  Provider: Sheliah Hatch  Department: [facility]  Visit Type: OFFICE VISIT  Date: 07/09/2023  Medication: VEOZAH 45 MG TABS  Has the patient contacted their pharmacy? Yes (Agent: If no, request that the patient contact the pharmacy for the refill. If patient does not wish to contact the pharmacy document the reason why and proceed with request.) (Agent: If yes, when and what did the pharmacy advise?)  Is this the correct pharmacy for this prescription? Yes If no, delete pharmacy and type the correct one.  This is the patient's preferred pharmacy:   [phone] - [place], [place] - [address] [place] [address] Phone: [phone] Fax: [phone]   Has the prescription been filled recently? Yes  Is the patient out of the medication? Yes  Has the patient been seen for an appointment in the last year OR does the patient have an upcoming appointment? Yes  Can we respond through MyChart? Yes  Agent: Please be advised that Rx refills may take up to 3 business days. We ask that you follow-up with your pharmacy.

## 2023-09-25 MED ORDER — VEOZAH 45 MG PO TABS
1.0000 | ORAL_TABLET | Freq: Every day | ORAL | 1 refills | Status: DC
Start: 2023-09-25 — End: 2024-02-04

## 2023-09-28 ENCOUNTER — Other Ambulatory Visit (HOSPITAL_COMMUNITY): Payer: Self-pay

## 2023-09-28 ENCOUNTER — Ambulatory Visit: Payer: Self-pay | Admitting: Family Medicine

## 2023-09-28 ENCOUNTER — Telehealth: Payer: Self-pay

## 2023-09-28 NOTE — Telephone Encounter (Signed)
 Sent to PA team.

## 2023-09-28 NOTE — Telephone Encounter (Signed)
 Copied from CRM [id number]. Topic: Clinical - Prescription Issue >> Sep 28, 2023 10:26 AM Arm[place] J wrote: Reason for CRM: Prior authorization needed for medication due to new insurance. / Rosann Auerbach: [id number] Group Number: [id number] Phone Number: [phone]    Additional Notes: The patient called in regarding getting prior authorization for Vioza, the callback number for the insurance company is Rosann Auerbach and the contact number regarding Prior Authorization Process is [phone].

## 2023-09-28 NOTE — Telephone Encounter (Signed)
 Pt has been notified.

## 2023-09-30 ENCOUNTER — Other Ambulatory Visit (HOSPITAL_COMMUNITY): Payer: Self-pay

## 2023-10-01 ENCOUNTER — Other Ambulatory Visit (HOSPITAL_COMMUNITY): Payer: Self-pay

## 2023-10-12 ENCOUNTER — Other Ambulatory Visit (HOSPITAL_COMMUNITY): Payer: Self-pay

## 2023-10-27 ENCOUNTER — Other Ambulatory Visit (HOSPITAL_COMMUNITY): Payer: Self-pay

## 2023-11-26 ENCOUNTER — Other Ambulatory Visit (HOSPITAL_COMMUNITY): Payer: Self-pay

## 2023-12-25 LAB — HM MAMMOGRAPHY

## 2023-12-30 ENCOUNTER — Other Ambulatory Visit (HOSPITAL_COMMUNITY): Payer: Self-pay

## 2023-12-30 ENCOUNTER — Telehealth: Payer: Self-pay

## 2023-12-30 NOTE — Telephone Encounter (Signed)
 Pharmacy Patient Advocate Encounter   Received notification from Patient Pharmacy that prior authorization for Veozah  45 is required/requested.   Insurance verification completed.   The patient is insured through [employer] . Patient Medicaid is expired   Per test claim: [employer] has a plan benefit exclusion

## 2024-01-11 ENCOUNTER — Ambulatory Visit: Payer: Self-pay

## 2024-01-11 NOTE — Telephone Encounter (Signed)
  FYI Only or Action Required?: FYI only for provider.  Patient was last seen in primary care on 07/09/2023 by Mahlon Comer BRAVO, MD. Called Nurse Triage reporting No chief complaint on file.. Symptoms began several days ago. Interventions attempted: Nothing. Symptoms are: gradually improving.  Triage Disposition: See PCP When Office is Open (Within 3 Days)  Patient/caregiver understands and will follow disposition?: Yes  Copied from CRM [id number]. Topic: Clinical - Red Word Triage >> Jan 11, 2024 10:23 AM Drema MATSU wrote: Red Word that prompted transfer to Nurse Triage: Patient went to [place]  over the weekend and thought she contracted covid or strep. Patient went to the mini clinic and was tested for both' tests came back negative but is still having problems. She stated that she it has gotten better but she is still having difficulty swallowing and it is painful. Reason for Disposition  [1] Sore throat is the only symptom AND [2] present > 48 hours  Answer Assessment - Initial Assessment Questions 1. ONSET: When did the throat start hurting? (Hours or days ago)      Over the weekend  2. SEVERITY: How bad is the sore throat? (Scale 1-10; mild, moderate or severe)   - MILD (1-3):  Doesn't interfere with eating or normal activities.   - MODERATE (4-7): Interferes with eating some solids and normal activities.   - SEVERE (8-10):  Excruciating pain, interferes with most normal activities.   - SEVERE WITH DYSPHAGIA (10): Can't swallow liquids, drooling.     Mild  3. STREP EXPOSURE: Has there been any exposure to strep within the past week? If Yes, ask: What type of contact occurred?      Unsure 4.  VIRAL SYMPTOMS: Are there any symptoms of a cold, such as a runny nose, cough, hoarse voice or red eyes?      Nasal Congestion  5. FEVER: Do you have a fever? If Yes, ask: What is your temperature, how was it measured, and when did it start?     No  6. PUS ON THE TONSILS:  Is there pus on the tonsils in the back of your throat?     No  7. OTHER SYMPTOMS: Do you have any other symptoms? (e.g., difficulty breathing, headache, rash)     No  8. PREGNANCY: Is there any chance you are pregnant? When was your last menstrual period?     No and no  Protocols used: Sore Throat-A-AH

## 2024-01-12 ENCOUNTER — Encounter: Payer: Self-pay | Admitting: Family Medicine

## 2024-01-12 ENCOUNTER — Ambulatory Visit (INDEPENDENT_AMBULATORY_CARE_PROVIDER_SITE_OTHER): Admitting: Family Medicine

## 2024-01-12 VITALS — BP 123/86 | HR 74 | Temp 97.6°F | Ht 65.0 in | Wt 191.8 lb

## 2024-01-12 DIAGNOSIS — J069 Acute upper respiratory infection, unspecified: Secondary | ICD-10-CM | POA: Diagnosis not present

## 2024-01-12 MED ORDER — PREDNISONE 10 MG PO TABS: ORAL_TABLET | ORAL | 0 refills | Status: AC

## 2024-01-12 NOTE — Patient Instructions (Signed)
 Follow up as needed or as scheduled START the Prednisone as directed- 3 pills at the same time x3 days, then 2 pills at the same time x3 days, then 1 pill daily.  Take w/ food  Drink LOTS of fluids REST! Call with any questions or concerns [facility] [place]!!!
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
## 2024-02-04 ENCOUNTER — Other Ambulatory Visit: Payer: Self-pay | Admitting: Family Medicine

## 2024-02-04 ENCOUNTER — Other Ambulatory Visit (HOSPITAL_COMMUNITY): Payer: Self-pay

## 2024-02-04 ENCOUNTER — Telehealth: Payer: Self-pay

## 2024-02-04 NOTE — Telephone Encounter (Signed)
 Prior Authorization form/request asks a question that requires your assistance. Please see the question below and advise accordingly. The PA will not be submitted until the necessary information is received.

## 2024-02-04 NOTE — Telephone Encounter (Signed)
 Can you look into this? A PA is needed for Veozah  45. Patient has [employer].

## 2024-02-04 NOTE — Telephone Encounter (Signed)
 Pharmacy Patient Advocate Encounter   Received notification from Pt Calls Messages that prior authorization for Veozah  45MG  tablets is required/requested.   Insurance verification completed.   The patient is insured through [employer] .   Per test claim:  ESTRADIOL  TABLETS/PATCH is preferred by the insurance.  If suggested medication is appropriate, Please send in a new RX and discontinue this one. If not, please advise as to why it's not appropriate so that we may request a Prior Authorization. Please note, some preferred medications may still require a PA.  If the suggested medications have not been trialed and there are no contraindications to their use, the PA will not be submitted, as it will not be approved.

## 2024-02-04 NOTE — Telephone Encounter (Signed)
 Please see message below if medication can be changed to the preferred medication by insurance if applicable.

## 2024-02-04 NOTE — Telephone Encounter (Signed)
 Pharmacy Patient Advocate Encounter   Received notification from CoverMyMeds that prior authorization for Veozah  45 is required/requested.   Insurance verification completed.   The patient is insured through [employer] .   Per test claim: patient has plan benefit exclusion. Please see encounter 12/30/23.

## 2024-02-22 ENCOUNTER — Other Ambulatory Visit (HOSPITAL_COMMUNITY): Payer: Self-pay

## 2024-02-22 ENCOUNTER — Telehealth: Payer: Self-pay

## 2024-02-22 NOTE — Telephone Encounter (Signed)
 Pharmacy Patient Advocate Encounter   Received notification from Pt Calls Messages that prior authorization for Veozah  45MG  tablets is required/requested.   Insurance verification completed.   The patient is insured through [employer] .   Per test claim: PA required; PA submitted to above mentioned insurance via CoverMyMeds Key/confirmation #/[facility] Status is pending

## 2024-02-22 NOTE — Telephone Encounter (Signed)
 She has a family hx of breast cancer and stroke and we have been avoiding estrogen

## 2024-02-22 NOTE — Telephone Encounter (Signed)
 Pharmacy Patient Advocate Encounter  Received notification from [employer] that Prior Authorization for Veozah  45MG  tablets  has been DENIED.  Full denial letter will be uploaded to the media tab. See denial reason below.   PA #/Case ID/Reference #: [id number]

## 2024-02-22 NOTE — Telephone Encounter (Signed)
 PA was denied for Veozah . FYI   Copied from CRM [id number]. Topic: Clinical - Medication Prior Auth >> Feb 22, 2024  3:42 PM Thersia BROCKS wrote: Reason for CRM: Jasmine from [employer] called in regarding Prior Auth for medication Veozah  45 MG , was denied and will be receiving a fax with denial reason and instructions on appeal

## 2024-02-22 NOTE — Telephone Encounter (Signed)
 PA request expired.   PA request has been Submitted. New Encounter has been or will be created for follow up. For additional info see Pharmacy Prior Auth telephone encounter from 02/22/24

## 2024-02-22 NOTE — Telephone Encounter (Signed)
 Since pt's prior authorization for Veozah  was denied, she can either see how things go without medication or we can schedule an appt to discuss our next options

## 2024-04-07 ENCOUNTER — Encounter: Payer: Self-pay | Admitting: Family Medicine

## 2024-04-07 ENCOUNTER — Ambulatory Visit: Admitting: Family Medicine

## 2024-04-07 VITALS — BP 118/72 | HR 78 | Temp 98.0°F | Ht 65.0 in | Wt 202.0 lb

## 2024-04-07 DIAGNOSIS — R350 Frequency of micturition: Secondary | ICD-10-CM

## 2024-04-07 DIAGNOSIS — R21 Rash and other nonspecific skin eruption: Secondary | ICD-10-CM

## 2024-04-07 DIAGNOSIS — K5904 Chronic idiopathic constipation: Secondary | ICD-10-CM

## 2024-04-07 LAB — POCT URINALYSIS DIPSTICK
Bilirubin, UA: NEGATIVE
Glucose, UA: NEGATIVE
Ketones, UA: NEGATIVE
Nitrite, UA: NEGATIVE
Protein, UA: NEGATIVE
Spec Grav, UA: 1.025 (ref 1.010–1.025)
Urobilinogen, UA: 0.2 U/dL
pH, UA: 5.5 (ref 5.0–8.0)

## 2024-04-07 MED ORDER — CEPHALEXIN 500 MG PO CAPS: 500.0000 mg | ORAL_CAPSULE | Freq: Two times a day (BID) | ORAL | 0 refills | Status: AC

## 2024-04-07 MED ORDER — TRIAMCINOLONE ACETONIDE 0.1 % EX OINT: 1.0000 | TOPICAL_OINTMENT | Freq: Two times a day (BID) | CUTANEOUS | 1 refills | Status: AC

## 2024-04-07 NOTE — Patient Instructions (Addendum)
 Follow up as needed or as scheduled We're sending the urine for culture, but in the meantime, START the Cephalexin  twice daily Continue to drink LOTS of water If the culture is negative, we'll send you to a Urologist for a complete evaluation USE the Triamcinolone  ointment on your neck twice daily until the rash improves We'll call you to schedule your GI appt- the constipation may have something to do with the frequent urination if the colon is full and pushing on the bladder Call with any questions or concerns Hang in there!

## 2024-04-07 NOTE — Progress Notes (Signed)
   Subjective:    Patient ID: Olivia Simon, female    DOB: [birth year], 53 y.o.   MRN: [id number]  HPI Urinary frequency- pt reports increased frequency particularly at night.  Denies burning w/ urination.  Urine appears clear.    Rash- first noticed ~1 week ago.  Has been using cocoa butter.  Doesn't itch.  Not spreading.  Only on L side of neck.  Constipation- pt reports dependence on Ex-lax.  Is taking Metamucil daily.  Drinking water and eating veggies.  Having BM's ~2x/week when she takes the Ex-lax.  Previously took Linzess  w/o relief.     Review of Systems For ROS see HPI     Objective:   Physical Exam Vitals reviewed.  Constitutional:      General: She is not in acute distress.    Appearance: Normal appearance. She is not ill-appearing.  HENT:     Head: Normocephalic and atraumatic.  Eyes:     Extraocular Movements: Extraocular movements intact.     Conjunctiva/sclera: Conjunctivae normal.  Abdominal:     General: There is no distension.     Palpations: Abdomen is soft.     Tenderness: There is no abdominal tenderness. There is no guarding or rebound.  Skin:    General: Skin is warm and dry.     Findings: Rash (hyperpigmented patches on L side of neck) present.  Neurological:     Mental Status: She is alert and oriented to person, place, and time. Mental status is at baseline.  Psychiatric:        Mood and Affect: Mood normal.        Behavior: Behavior normal.        Thought Content: Thought content normal.           Assessment & Plan:  Urine frequency- ongoing issue.  Pt reports frequency and urgency but denies dysuria or hematuria.  UA suspicious for infxn.  Will start Keflex  and await culture results.  Did discuss that her chronic constipation may be causing her urinary frequency.  Will follow.  Constipation- ongoing issue for pt.  She reports she was previously on Linzess  w/o relief.  Now has to take Ex-Lax to have a BM at all.  Discussed that this  could be causing her urine frequency.  Referred to GI at pt's request.  Rash- new.  Appears consistent w/ either contact dermatitis or eczema.  Will start Triamcinolone  ointment.  Pt expressed understanding and is in agreement w/ plan.

## 2024-04-08 LAB — URINE CULTURE
MICRO NUMBER:: 16986388
Result:: NO GROWTH
SPECIMEN QUALITY:: ADEQUATE

## 2024-04-10 ENCOUNTER — Ambulatory Visit: Payer: Self-pay | Admitting: Family Medicine

## 2024-04-10 DIAGNOSIS — R3129 Other microscopic hematuria: Secondary | ICD-10-CM

## 2024-04-11 NOTE — Telephone Encounter (Signed)
 Messaged patient via MyChart  Placed referral

## 2024-04-11 NOTE — Telephone Encounter (Signed)
 Reason for CRM: Patient is asking if she should take the medication for the infection, since she was negative for the infection. Please advise the patient.

## 2024-04-11 NOTE — Progress Notes (Signed)
 Called patient and advised to stop abx. Patient verbalized understanding.
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
## 2024-04-25 ENCOUNTER — Ambulatory Visit: Admitting: Urology

## 2024-04-25 VITALS — BP 151/98 | HR 75 | Ht 65.0 in | Wt 195.0 lb

## 2024-04-25 DIAGNOSIS — R350 Frequency of micturition: Secondary | ICD-10-CM

## 2024-04-25 DIAGNOSIS — R351 Nocturia: Secondary | ICD-10-CM

## 2024-04-25 DIAGNOSIS — R3129 Other microscopic hematuria: Secondary | ICD-10-CM | POA: Diagnosis not present

## 2024-04-25 LAB — MICROSCOPIC EXAMINATION: Bacteria, UA: NONE SEEN

## 2024-04-25 LAB — URINALYSIS, ROUTINE W REFLEX MICROSCOPIC
Bilirubin, UA: NEGATIVE
Glucose, UA: NEGATIVE
Ketones, UA: NEGATIVE
Leukocytes,UA: NEGATIVE
Nitrite, UA: NEGATIVE
Protein,UA: NEGATIVE
Specific Gravity, UA: 1.015 (ref 1.005–1.030)
Urobilinogen, Ur: 0.2 mg/dL (ref 0.2–1.0)
pH, UA: 7.5 (ref 5.0–7.5)

## 2024-04-25 LAB — BLADDER SCAN AMB NON-IMAGING

## 2024-04-25 MED ORDER — SOLIFENACIN SUCCINATE 10 MG PO TABS
10.0000 mg | ORAL_TABLET | Freq: Every day | ORAL | 11 refills | Status: AC
Start: 2024-04-25 — End: ?

## 2024-04-26 ENCOUNTER — Ambulatory Visit (HOSPITAL_BASED_OUTPATIENT_CLINIC_OR_DEPARTMENT_OTHER)

## 2024-05-05 ENCOUNTER — Ambulatory Visit: Payer: Self-pay | Admitting: Urology

## 2024-05-05 ENCOUNTER — Encounter (HOSPITAL_BASED_OUTPATIENT_CLINIC_OR_DEPARTMENT_OTHER): Payer: Self-pay

## 2024-05-05 ENCOUNTER — Ambulatory Visit (HOSPITAL_BASED_OUTPATIENT_CLINIC_OR_DEPARTMENT_OTHER)
Admission: RE | Admit: 2024-05-05 | Discharge: 2024-05-05 | Disposition: A | Discharge status: Home / Self Care | Source: Ambulatory Visit | Attending: Urology | Admitting: Urology

## 2024-05-05 DIAGNOSIS — R3129 Other microscopic hematuria: Secondary | ICD-10-CM | POA: Insufficient documentation

## 2024-05-05 MED ORDER — IOHEXOL 300 MG/ML  SOLN
125.0000 mL | Freq: Once | INTRAMUSCULAR | Status: AC | PRN
  Administered 2024-05-05: 125 mL via INTRAVENOUS

## 2024-05-24 ENCOUNTER — Other Ambulatory Visit: Payer: Self-pay | Admitting: Family Medicine

## 2024-05-27 ENCOUNTER — Other Ambulatory Visit: Payer: Self-pay

## 2024-05-27 DIAGNOSIS — K5900 Constipation, unspecified: Secondary | ICD-10-CM
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
## 2024-05-30 ENCOUNTER — Ambulatory Visit: Admitting: Urology

## 2024-05-30 VITALS — BP 148/99 | HR 74 | Ht 65.0 in | Wt 200.0 lb

## 2024-05-30 DIAGNOSIS — R3129 Other microscopic hematuria: Secondary | ICD-10-CM | POA: Diagnosis not present

## 2024-05-30 DIAGNOSIS — R350 Frequency of micturition: Secondary | ICD-10-CM | POA: Diagnosis not present

## 2024-05-30 MED ORDER — CIPROFLOXACIN HCL 500 MG PO TABS
500.0000 mg | ORAL_TABLET | Freq: Once | ORAL | Status: AC
  Administered 2024-05-30: 500 mg via ORAL

## 2024-06-01 ENCOUNTER — Other Ambulatory Visit: Admitting: Urology

## 2024-06-15 ENCOUNTER — Ambulatory Visit: Admitting: Family Medicine

## 2024-08-12 ENCOUNTER — Other Ambulatory Visit: Payer: Self-pay

## 2024-08-12 ENCOUNTER — Other Ambulatory Visit: Payer: Self-pay | Admitting: Family Medicine
